# Patient Record
Sex: Female | Born: 1970 | Race: White | Hispanic: No | Smoking: Never smoker
Health system: Southern US, Community
[De-identification: ages and names within clinical notes are randomized; demographics above are authoritative.]

---

## 2011-04-06 DIAGNOSIS — D229 Melanocytic nevi, unspecified: Secondary | ICD-10-CM

## 2011-04-06 HISTORY — DX: Melanocytic nevi, unspecified: D22.9

## 2013-08-15 ENCOUNTER — Ambulatory Visit (INDEPENDENT_AMBULATORY_CARE_PROVIDER_SITE_OTHER): Payer: BC Managed Care – PPO

## 2013-08-15 DIAGNOSIS — Z23 Encounter for immunization: Secondary | ICD-10-CM

## 2013-08-22 ENCOUNTER — Ambulatory Visit: Payer: Self-pay

## 2014-08-02 ENCOUNTER — Ambulatory Visit (INDEPENDENT_AMBULATORY_CARE_PROVIDER_SITE_OTHER): Payer: BC Managed Care – PPO

## 2014-08-02 DIAGNOSIS — Z23 Encounter for immunization: Secondary | ICD-10-CM

## 2014-08-02 NOTE — Addendum Note (Signed)
Addended by: Jamelle Haring on: 08/02/2014 03:44 PM   Modules accepted: Orders

## 2015-08-02 ENCOUNTER — Ambulatory Visit (INDEPENDENT_AMBULATORY_CARE_PROVIDER_SITE_OTHER): Payer: BLUE CROSS/BLUE SHIELD

## 2015-08-02 DIAGNOSIS — Z23 Encounter for immunization: Secondary | ICD-10-CM

## 2016-04-10 ENCOUNTER — Encounter (INDEPENDENT_AMBULATORY_CARE_PROVIDER_SITE_OTHER): Payer: Self-pay

## 2016-08-11 ENCOUNTER — Ambulatory Visit (INDEPENDENT_AMBULATORY_CARE_PROVIDER_SITE_OTHER): Payer: BLUE CROSS/BLUE SHIELD

## 2016-08-11 DIAGNOSIS — Z23 Encounter for immunization: Secondary | ICD-10-CM

## 2016-08-17 ENCOUNTER — Ambulatory Visit: Payer: BLUE CROSS/BLUE SHIELD

## 2016-11-03 DIAGNOSIS — L814 Other melanin hyperpigmentation: Secondary | ICD-10-CM | POA: Diagnosis not present

## 2016-11-03 DIAGNOSIS — L821 Other seborrheic keratosis: Secondary | ICD-10-CM | POA: Diagnosis not present

## 2016-11-03 DIAGNOSIS — D492 Neoplasm of unspecified behavior of bone, soft tissue, and skin: Secondary | ICD-10-CM | POA: Diagnosis not present

## 2016-11-03 DIAGNOSIS — D229 Melanocytic nevi, unspecified: Secondary | ICD-10-CM | POA: Diagnosis not present

## 2017-02-09 DIAGNOSIS — D229 Melanocytic nevi, unspecified: Secondary | ICD-10-CM | POA: Diagnosis not present

## 2017-02-09 DIAGNOSIS — L821 Other seborrheic keratosis: Secondary | ICD-10-CM | POA: Diagnosis not present

## 2017-08-10 DIAGNOSIS — D492 Neoplasm of unspecified behavior of bone, soft tissue, and skin: Secondary | ICD-10-CM | POA: Diagnosis not present

## 2017-08-10 DIAGNOSIS — D485 Neoplasm of uncertain behavior of skin: Secondary | ICD-10-CM | POA: Diagnosis not present

## 2017-08-26 ENCOUNTER — Ambulatory Visit (INDEPENDENT_AMBULATORY_CARE_PROVIDER_SITE_OTHER): Payer: BLUE CROSS/BLUE SHIELD

## 2017-08-26 DIAGNOSIS — Z23 Encounter for immunization: Secondary | ICD-10-CM | POA: Diagnosis not present

## 2018-01-31 ENCOUNTER — Encounter: Payer: Self-pay | Admitting: Nurse Practitioner

## 2018-01-31 ENCOUNTER — Ambulatory Visit: Payer: BLUE CROSS/BLUE SHIELD | Admitting: Nurse Practitioner

## 2018-01-31 VITALS — BP 130/90 | HR 84 | Temp 96.7°F | Wt 191.0 lb

## 2018-01-31 DIAGNOSIS — R059 Cough, unspecified: Secondary | ICD-10-CM

## 2018-01-31 DIAGNOSIS — R05 Cough: Secondary | ICD-10-CM

## 2018-01-31 MED ORDER — PREDNISONE 20 MG PO TABS: ORAL_TABLET | ORAL | 0 refills | Status: DC

## 2018-01-31 MED ORDER — BENZONATATE 100 MG PO CAPS
100.0000 mg | ORAL_CAPSULE | Freq: Three times a day (TID) | ORAL | 0 refills | Status: DC | PRN
Start: 2018-01-31 — End: 2018-02-09

## 2018-01-31 MED ORDER — FLUTICASONE PROPIONATE 50 MCG/ACT NA SUSP
2.0000 | Freq: Every day | NASAL | 6 refills | Status: DC
Start: 2018-01-31 — End: 2021-07-10

## 2018-01-31 NOTE — Progress Notes (Signed)
   Subjective:    Patient ID: Whitney Huff, female    DOB: [birth year], 47 y.o.   MRN: [id number]  HPI Patient comes in today accompanied by her mom and daughter. She is c/o cough for about 4 weeks. Is a dry hacky cough. Has been using mucinex and that is not helping. Is exposed to cigarette smoke daily at work. She takes a daily zyrtec.    Review of Systems  Constitutional: Negative for chills and fever.  HENT: Positive for congestion.   Respiratory: Positive for cough. Negative for shortness of breath.   Cardiovascular: Negative.   Gastrointestinal: Negative.   Genitourinary: Negative.   Neurological: Negative.   Psychiatric/Behavioral: Negative.   All other systems reviewed and are negative.      Objective:   Physical Exam  Constitutional: She is oriented to person, place, and time. She appears well-developed and well-nourished. She appears distressed (mild).  HENT:  Right Ear: External ear normal.  Left Ear: External ear normal.  Nose: Nose normal.  Mouth/Throat: Oropharynx is clear and moist.  Eyes: Pupils are equal, round, and reactive to light.  Neck: Normal range of motion. Neck supple.  Cardiovascular: Normal rate and regular rhythm.  Pulmonary/Chest: Effort normal and breath sounds normal.  Lymphadenopathy:    She has no cervical adenopathy.  Neurological: She is alert and oriented to person, place, and time.  Skin: Skin is warm and dry.  Psychiatric: She has a normal mood and affect. Her behavior is normal. Judgment and thought content normal.   BP 130/90   Pulse 84   Temp (!) 96.7 F (35.9 C) (Oral)   Wt 191 lb (86.6 kg)         Assessment & Plan:   1. Cough    Meds ordered this encounter  Medications  . fluticasone (FLONASE) 50 MCG/ACT nasal spray    Sig: Place 2 sprays into both nostrils daily.    Dispense:  16 g    Refill:  6    Order Specific Question:   Supervising Provider    Answer:   VINCENT, CAROL L [[id number]]  . predniSONE (DELTASONE) 20  MG tablet    Sig: 2 po at sametime daily for 5 days    Dispense:  10 tablet    Refill:  0    Order Specific Question:   Supervising Provider    Answer:   VINCENT, CAROL L [[id number]]  . benzonatate (TESSALON PERLES) 100 MG capsule    Sig: Take 1 capsule (100 mg total) by mouth 3 (three) times daily as needed for cough.    Dispense:  20 capsule    Refill:  0    Order Specific Question:   Supervising Provider    Answer:   Eustaquio Maize [[id number]]   Force fluids  Rest RTO prn  Mary-Margaret Hassell Done, FNP

## 2018-01-31 NOTE — Patient Instructions (Signed)

## 2018-02-09 ENCOUNTER — Encounter: Payer: Self-pay | Admitting: Family Medicine

## 2018-02-09 ENCOUNTER — Ambulatory Visit: Payer: BLUE CROSS/BLUE SHIELD | Admitting: Family Medicine

## 2018-02-09 VITALS — BP 136/86 | HR 95 | Temp 97.0°F | Ht >= 80 in | Wt 193.0 lb

## 2018-02-09 DIAGNOSIS — J309 Allergic rhinitis, unspecified: Secondary | ICD-10-CM | POA: Diagnosis not present

## 2018-02-09 DIAGNOSIS — H1013 Acute atopic conjunctivitis, bilateral: Secondary | ICD-10-CM | POA: Diagnosis not present

## 2018-02-09 MED ORDER — METHYLPREDNISOLONE ACETATE 80 MG/ML IJ SUSP
40.0000 mg | Freq: Once | INTRAMUSCULAR | Status: AC
  Administered 2018-02-09: 40 mg via INTRAMUSCULAR

## 2018-02-09 MED ORDER — OLOPATADINE HCL 0.1 % OP SOLN: 1.0000 [drp] | Freq: Two times a day (BID) | OPHTHALMIC | 12 refills | Status: DC

## 2018-02-09 NOTE — Patient Instructions (Signed)
I prescribed you Patanol eyedrops.  You may place 1 drop in each eye twice a day as needed for eye allergies.  I recommend for the next few days that you add Claritin to your current allergy regimen.  Take Zyrtec and Claritin about 12 hours apart from each other.  Continue Flonase.  Consider using sinus rinses.  This will likely get any residual pollen out of your sinuses and reduce your allergy response.  You are given a dose of steroid through an injection today to help reduce her allergy response.  If you notice persistent significant allergy symptoms, we may need to consider referral to an allergy specialist to further assist.  Allergic Rhinitis, Adult Allergic rhinitis is an allergic reaction that affects the mucous membrane inside the nose. It causes sneezing, a runny or stuffy nose, and the feeling of mucus going down the back of the throat (postnasal drip). Allergic rhinitis can be mild to severe. There are two types of allergic rhinitis:  Seasonal. This type is also called hay fever. It happens only during certain seasons.  Perennial. This type can happen at any time of the year.  What are the causes? This condition happens when the body's defense system (immune system) responds to certain harmless substances called allergens as though they were germs.  Seasonal allergic rhinitis is triggered by pollen, which can come from grasses, trees, and weeds. Perennial allergic rhinitis may be caused by:  House dust mites.  Pet dander.  Mold spores.  What are the signs or symptoms? Symptoms of this condition include:  Sneezing.  Runny or stuffy nose (nasal congestion).  Postnasal drip.  Itchy nose.  Tearing of the eyes.  Trouble sleeping.  Daytime sleepiness.  How is this diagnosed? This condition may be diagnosed based on:  Your medical history.  A physical exam.  Tests to check for related conditions, such as: ? Asthma. ? Pink eye. ? Ear infection. ? Upper respiratory  infection.  Tests to find out which allergens trigger your symptoms. These may include skin or blood tests.  How is this treated? There is no cure for this condition, but treatment can help control symptoms. Treatment may include:  Taking medicines that block allergy symptoms, such as antihistamines. Medicine may be given as a shot, nasal spray, or pill.  Avoiding the allergen.  Desensitization. This treatment involves getting ongoing shots until your body becomes less sensitive to the allergen. This treatment may be done if other treatments do not help.  If taking medicine and avoiding the allergen does not work, new, stronger medicines may be prescribed.  Follow these instructions at home:  Find out what you are allergic to. Common allergens include smoke, dust, and pollen.  Avoid the things you are allergic to. These are some things you can do to help avoid allergens: ? Replace carpet with wood, tile, or vinyl flooring. Carpet can trap dander and dust. ? Do not smoke. Do not allow smoking in your home. ? Change your heating and air conditioning filter at least once a month. ? During allergy season:  Keep windows closed as much as possible.  Plan outdoor activities when pollen counts are lowest. This is usually during the evening hours.  When coming indoors, change clothing and shower before sitting on furniture or bedding.  Take over-the-counter and prescription medicines only as told by your health care provider.  Keep all follow-up visits as told by your health care provider. This is important. Contact a health care provider if:  You have a fever.  You develop a persistent cough.  You make whistling sounds when you breathe (you wheeze).  Your symptoms interfere with your normal daily activities. Get help right away if:  You have shortness of breath. Summary  This condition can be managed by taking medicines as directed and avoiding allergens.  Contact your  health care provider if you develop a persistent cough or fever.  During allergy season, keep windows closed as much as possible. This information is not intended to replace advice given to you by your health care provider. Make sure you discuss any questions you have with your health care provider. Document Released: 07/07/2001 Document Revised: 11/19/2016 Document Reviewed: 11/19/2016 Elsevier Interactive Patient Education  2018 [employer].  Sinus Rinse What is a sinus rinse? A sinus rinse is a home treatment. It rinses your sinuses with a mixture of salt and water (saline solution). Sinuses are air-filled spaces in your skull behind the bones of your face and forehead. They open into your nasal cavity. To do a sinus rinse, you will need:  Saline solution.  Neti pot or spray bottle. This releases the saline solution into your nose and through your sinuses. You can buy neti pots and spray bottles at: ? [employer]. ? A health food store. ? Online.  When should I do a sinus rinse? A sinus rinse can help to clear your nasal cavity. It can clear:  Mucus.  Dirt.  Dust.  Pollen.  You may do a sinus rinse when you have:  A cold.  A virus.  Allergies.  A sinus infection.  A stuffy nose.  If you are considering a sinus rinse:  Ask your child's doctor before doing a sinus rinse on your child.  Do not do a sinus rinse if you have had: ? Ear or nasal surgery. ? An ear infection. ? Blocked ears.  How do I do a sinus rinse?  Wash your hands.  Disinfect your device using the directions that came with the device.  Dry your device.  Use the solution that comes with your device or one that is sold separately in stores. Follow the mixing directions on the package.  Fill your device with the amount of saline solution as stated in the device instructions.  Stand over a sink and tilt your head sideways over the sink.  Place the spout of the device in your upper  nostril (the one closer to the ceiling).  Gently pour or squeeze the saline solution into the nasal cavity. The liquid should drain to the lower nostril if you are not too congested.  Gently blow your nose. Blowing too hard may cause ear pain.  Repeat in the other nostril.  Clean and rinse your device with clean water.  Air-dry your device. Are there risks of a sinus rinse? Sinus rinse is normally very safe and helpful. However, there are a few risks, which include:  A burning feeling in the sinuses. This may happen if you do not make the saline solution as instructed. Make sure to follow all directions when making the saline solution.  Infection from unclean water. This is rare, but possible.  Nasal irritation.  This information is not intended to replace advice given to you by your health care provider. Make sure you discuss any questions you have with your health care provider. Document Released: 05/09/2014 Document Revised: 09/08/2016 Document Reviewed: 02/27/2014 Elsevier Interactive Patient Education  2017 [employer].

## 2018-02-09 NOTE — Progress Notes (Signed)
Subjective: CC: URI PCP: Chevis Pretty, FNP Whitney Huff is a 47 y.o. female presenting to clinic today for:  1. Sinus symptoms  Patient reports watery eyes, puffy eyes, facial and dental tenderness that onset after she cleaned pollen off of a vehicle earlier this week.  She has been using Zyrtec and Flonase for symptoms but symptoms have persisted.  She notes associated throat irritation.  She was actually seen about 10 days ago for similar symptoms and was placed on Tessalon Perles and oral prednisone.  She is also been using an over-the-counter allergy eyedrop with little relief in symptoms this go around.  She notes that she divided the prednisone to 20 mg daily for the last 10 days because she was unable to tolerate 40 mg at a time.  She denies fevers, chills, nausea, vomiting, purulence from nares.  No shortness of breath.  ROS: Per HPI  No Known Allergies No past medical history on file.  Current Outpatient Medications:  .  benzonatate (TESSALON PERLES) 100 MG capsule, Take 1 capsule (100 mg total) by mouth 3 (three) times daily as needed for cough., Disp: 20 capsule, Rfl: 0 .  esomeprazole (NEXIUM) 20 MG capsule, Take 20 mg by mouth daily at 12 noon., Disp: , Rfl:  .  fluticasone (FLONASE) 50 MCG/ACT nasal spray, Place 2 sprays into both nostrils daily., Disp: 16 g, Rfl: 6 .  predniSONE (DELTASONE) 20 MG tablet, 2 po at sametime daily for 5 days, Disp: 10 tablet, Rfl: 0 Social History   Socioeconomic History  . Marital status: Widowed    Spouse name: Not on file  . Number of children: Not on file  . Years of education: Not on file  . Highest education level: Not on file  Occupational History  . Not on file  Social Needs  . Financial resource strain: Not on file  . Food insecurity:    Worry: Not on file    Inability: Not on file  . Transportation needs:    Medical: Not on file    Non-medical: Not on file  Tobacco Use  . Smoking status: Never Smoker  .  Smokeless tobacco: Never Used  Substance and Sexual Activity  . Alcohol use: Not on file  . Drug use: Not on file  . Sexual activity: Not on file  Lifestyle  . Physical activity:    Days per week: Not on file    Minutes per session: Not on file  . Stress: Not on file  Relationships  . Social connections:    Talks on phone: Not on file    Gets together: Not on file    Attends religious service: Not on file    Active member of club or organization: Not on file    Attends meetings of clubs or organizations: Not on file    Relationship status: Not on file  . Intimate partner violence:    Fear of current or ex partner: Not on file    Emotionally abused: Not on file    Physically abused: Not on file    Forced sexual activity: Not on file  Other Topics Concern  . Not on file  Social History Narrative  . Not on file   No family history on file.  Objective: Office vital signs reviewed. BP 136/86   Pulse 95   Temp (!) 97 F (36.1 C) (Oral)   Ht 7' (2.134 m)   Wt 193 lb (87.5 kg)   BMI 19.23 kg/m  Physical Examination:  General: Awake, alert, well nourished, nontoxic, No acute distress HEENT: Normal    Neck: No masses palpated. No lymphadenopathy    Ears: Tympanic membranes intact, normal light reflex, no erythema, no bulging    Eyes: PERRLA, extraocular membranes intact, sclera white; eyes are tearing.  There is associated puffiness below each eye.    Nose: nasal turbinates moist, clear nasal discharge    Throat: moist mucus membranes, no erythema, no tonsillar exudate.  Airway is patent Cardio: regular rate and rhythm, S1S2 heard, no murmurs appreciated Pulm: clear to auscultation bilaterally, no wheezes, rhonchi or rales; normal work of breathing on room air  Assessment/ Plan: 47 y.o. female   1. Allergic sinusitis She has been afebrile and not demonstrating any infection of symptoms.  I suspect that symptoms are related to pollen and change in season.  She was given  a dose of Depo-Medrol 40 mg here in office.  I have recommended that she continue the Zyrtec and Flonase but add Claritin, separated by 12 hours from the Zyrtec.  I have also replaced her over-the-counter eyedrop with Patanol to use twice daily for allergic conjunctivitis.  We discussed sinus rinses and a handout was provided.  Reasons for return discussed.  If she has persistent symptoms, could consider referral to allergy for further evaluation and management. - methylPREDNISolone acetate (DEPO-MEDROL) injection 40 mg  2. Allergic conjunctivitis of both eyes As above.  Meds ordered this encounter  Medications  . olopatadine (PATANOL) 0.1 % ophthalmic solution    Sig: Place 1 drop into both eyes 2 (two) times daily. For allergy    Dispense:  5 mL    Refill:  12  . methylPREDNISolone acetate (DEPO-MEDROL) injection 40 mg     Whitney Norlander, DO [facility] [phone]

## 2018-04-12 DIAGNOSIS — D229 Melanocytic nevi, unspecified: Secondary | ICD-10-CM | POA: Diagnosis not present

## 2018-04-12 DIAGNOSIS — L918 Other hypertrophic disorders of the skin: Secondary | ICD-10-CM | POA: Diagnosis not present

## 2018-04-12 DIAGNOSIS — D485 Neoplasm of uncertain behavior of skin: Secondary | ICD-10-CM | POA: Diagnosis not present

## 2018-04-25 ENCOUNTER — Encounter: Payer: Self-pay | Admitting: Family

## 2018-04-25 ENCOUNTER — Ambulatory Visit: Payer: BLUE CROSS/BLUE SHIELD | Admitting: Family

## 2018-04-25 VITALS — BP 146/96 | HR 95 | Temp 98.5°F | Ht 66.0 in | Wt 198.2 lb

## 2018-04-25 DIAGNOSIS — J020 Streptococcal pharyngitis: Secondary | ICD-10-CM | POA: Diagnosis not present

## 2018-04-25 DIAGNOSIS — J029 Acute pharyngitis, unspecified: Secondary | ICD-10-CM | POA: Diagnosis not present

## 2018-04-25 LAB — RAPID STREP SCREEN (MED CTR MEBANE ONLY): Strep Gp A Ag, IA W/Reflex: POSITIVE — AB

## 2018-04-25 MED ORDER — AMOXICILLIN 500 MG PO CAPS: 500.0000 mg | ORAL_CAPSULE | Freq: Two times a day (BID) | ORAL | 0 refills | Status: DC

## 2018-04-25 NOTE — Progress Notes (Signed)
   Subjective:    Patient ID: Whitney Huff, female    DOB: 1971-06-18, 47 y.o.   MRN: [id number]  Chief Complaint  Patient presents with  . Sore Throat    began yesterday  . Cough    Sore Throat   This is a new problem. The current episode started yesterday. The problem has been gradually worsening. There has been no fever. The pain is at a severity of 9/10. The pain is mild. Associated symptoms include congestion, coughing, a hoarse voice and trouble swallowing. Pertinent negatives include no ear discharge, ear pain, headaches or swollen glands. She has tried nothing for the symptoms. The treatment provided no relief.  Cough  Pertinent negatives include no ear pain or headaches.      Review of Systems  HENT: Positive for congestion, hoarse voice and trouble swallowing. Negative for ear discharge and ear pain.   Respiratory: Positive for cough.   Neurological: Negative for headaches.  All other systems reviewed and are negative.      Objective:   Physical Exam  Constitutional: She is oriented to person, place, and time. She appears well-developed and well-nourished. No distress.  HENT:  Head: Normocephalic and atraumatic.  Right Ear: External ear normal.  Nose: Mucosal edema and rhinorrhea present.  Mouth/Throat: Posterior oropharyngeal erythema present.  Eyes: Pupils are equal, round, and reactive to light.  Neck: Normal range of motion. Neck supple. No thyromegaly present.  Cardiovascular: Normal rate, regular rhythm, normal heart sounds and intact distal pulses.  No murmur heard. Pulmonary/Chest: Effort normal and breath sounds normal. No respiratory distress. She has no wheezes.  Abdominal: Soft. Bowel sounds are normal. She exhibits no distension. There is no tenderness.  Musculoskeletal: Normal range of motion. She exhibits no edema or tenderness.  Neurological: She is alert and oriented to person, place, and time. She has normal reflexes. No cranial nerve deficit.    Skin: Skin is warm and dry.  Psychiatric: She has a normal mood and affect. Her behavior is normal. Judgment and thought content normal.  Vitals reviewed.     BP (!) 146/96   Pulse 95   Temp 98.5 F (36.9 C) (Oral)   Ht 5\' 6"  (1.676 m)   Wt 198 lb 3.2 oz (89.9 kg)   BMI 31.99 kg/m      Assessment & Plan:  1. Sore throat - Rapid Strep Screen ([facility] & [facility] ONLY)  2. Strep throat - Take meds as prescribed - Use a cool mist humidifier  -Use saline nose sprays frequently -Force fluids -For any cough or congestion  Use plain Mucinex- regular strength or max strength is fine -For fever or aces or pains- take tylenol or ibuprofen. -Throat lozenges if help -New toothbrush in 3 days - amoxicillin (AMOXIL) 500 MG capsule; Take 1 capsule (500 mg total) by mouth 2 (two) times daily.  Dispense: 14 capsule; Refill: 0   Evelina Dun, FNP

## 2018-04-25 NOTE — Patient Instructions (Signed)
Strep Throat Strep throat is a bacterial infection of the throat. Your health care provider may call the infection tonsillitis or pharyngitis, depending on whether there is swelling in the tonsils or at the back of the throat. Strep throat is most common during the cold months of the year in children who are 5-49 years of age, but it can happen during any season in people of any age. This infection is spread from person to person (contagious) through coughing, sneezing, or close contact. What are the causes? Strep throat is caused by the bacteria called Streptococcus pyogenes. What increases the risk? This condition is more likely to develop in:  People who spend time in crowded places where the infection can spread easily.  People who have close contact with someone who has strep throat.  What are the signs or symptoms? Symptoms of this condition include:  Fever or chills.  Redness, swelling, or pain in the tonsils or throat.  Pain or difficulty when swallowing.  White or yellow spots on the tonsils or throat.  Swollen, tender glands in the neck or under the jaw.  Red rash all over the body (rare).  How is this diagnosed? This condition is diagnosed by performing a rapid strep test or by taking a swab of your throat (throat culture test). Results from a rapid strep test are usually ready in a few minutes, but throat culture test results are available after one or two days. How is this treated? This condition is treated with antibiotic medicine. Follow these instructions at home: Medicines  Take over-the-counter and prescription medicines only as told by your health care provider.  Take your antibiotic as told by your health care provider. Do not stop taking the antibiotic even if you start to feel better.  Have family members who also have a sore throat or fever tested for strep throat. They may need antibiotics if they have the strep infection. Eating and drinking  Do not  share food, drinking cups, or personal items that could cause the infection to spread to other people.  If swallowing is difficult, try eating soft foods until your sore throat feels better.  Drink enough fluid to keep your urine clear or pale yellow. General instructions  Gargle with a salt-water mixture 3-4 times per day or as needed. To make a salt-water mixture, completely dissolve -1 tsp of salt in 1 cup of warm water.  Make sure that all household members wash their hands well.  Get plenty of rest.  Stay home from school or work until you have been taking antibiotics for 24 hours.  Keep all follow-up visits as told by your health care provider. This is important. Contact a health care provider if:  The glands in your neck continue to get bigger.  You develop a rash, cough, or earache.  You cough up a thick liquid that is green, yellow-brown, or bloody.  You have pain or discomfort that does not get better with medicine.  Your problems seem to be getting worse rather than better.  You have a fever. Get help right away if:  You have new symptoms, such as vomiting, severe headache, stiff or painful neck, chest pain, or shortness of breath.  You have severe throat pain, drooling, or changes in your voice.  You have swelling of the neck, or the skin on the neck becomes red and tender.  You have signs of dehydration, such as fatigue, dry mouth, and decreased urination.  You become increasingly sleepy, or   you cannot wake up completely.  Your joints become red or painful. This information is not intended to replace advice given to you by your health care provider. Make sure you discuss any questions you have with your health care provider. Document Released: 10/09/2000 Document Revised: 06/10/2016 Document Reviewed: 02/04/2015 Elsevier Interactive Patient Education  2018 [employer]  

## 2018-04-29 ENCOUNTER — Encounter: Payer: Self-pay | Admitting: Nurse Practitioner

## 2018-04-29 ENCOUNTER — Ambulatory Visit: Payer: BLUE CROSS/BLUE SHIELD | Admitting: Nurse Practitioner

## 2018-04-29 VITALS — BP 137/100 | HR 83 | Temp 97.7°F | Ht 66.0 in | Wt 194.0 lb

## 2018-04-29 DIAGNOSIS — R05 Cough: Secondary | ICD-10-CM | POA: Diagnosis not present

## 2018-04-29 DIAGNOSIS — R059 Cough, unspecified: Secondary | ICD-10-CM

## 2018-04-29 MED ORDER — METHYLPREDNISOLONE ACETATE 80 MG/ML IJ SUSP
80.0000 mg | Freq: Once | INTRAMUSCULAR | Status: AC
  Administered 2018-04-29: 80 mg via INTRAMUSCULAR

## 2018-04-29 NOTE — Patient Instructions (Signed)

## 2018-04-29 NOTE — Progress Notes (Signed)
   Subjective:    Patient ID: Whitney Huff, female    DOB: 1971/01/17, 47 y.o.   MRN: [id number]   Chief Complaint: Cough   HPI Patient here today c/o cough. She was seen on Monday with strep. Was treated with amoxicillin. Sore thorat is better but she has bad cough.    Review of Systems  Constitutional: Negative for chills and fever.  HENT: Positive for congestion. Negative for sinus pressure, sore throat and trouble swallowing.   Respiratory: Positive for cough.   Cardiovascular: Negative.   Gastrointestinal: Negative.   Genitourinary: Negative.   Neurological: Negative.   Psychiatric/Behavioral: Negative.   All other systems reviewed and are negative.      Objective:   Physical Exam  Constitutional: She is oriented to person, place, and time. She appears well-developed and well-nourished. She appears distressed (mild).  HENT:  Right Ear: Hearing, tympanic membrane, external ear and ear canal normal.  Left Ear: Hearing, tympanic membrane, external ear and ear canal normal.  Nose: Mucosal edema and rhinorrhea present. Right sinus exhibits no maxillary sinus tenderness and no frontal sinus tenderness. Left sinus exhibits no maxillary sinus tenderness and no frontal sinus tenderness.  Mouth/Throat: Uvula is midline, oropharynx is clear and moist and mucous membranes are normal.  Neck: Normal range of motion. Neck supple.  Cardiovascular: Normal rate and regular rhythm.  Pulmonary/Chest: Effort normal and breath sounds normal.  Dry cough   Lymphadenopathy:    She has no cervical adenopathy.  Neurological: She is alert and oriented to person, place, and time.  Skin: Skin is warm and dry.  Psychiatric: She has a normal mood and affect. Her behavior is normal. Judgment and thought content normal.    BP (!) 137/100   Pulse 83   Temp 97.7 F (36.5 C) (Oral)   Ht 5\' 6"  (1.676 m)   Wt 194 lb (88 kg)   BMI 31.31 kg/m        Assessment & Plan:  Whitney Huff in  today with chief complaint of Cough   1. Cough 1. Take meds as prescribed 2. Use a cool mist humidifier especially during the winter months and when heat has been humid. 3. Use saline nose sprays frequently 4. Saline irrigations of the nose can be very helpful if Huff frequently.  * 4X daily for 1 week*  * Use of a nettie pot can be helpful with this. Follow directions with this* 5. Drink plenty of fluids 6. Keep thermostat turn down low 7.For any cough or congestion  Use plain Mucinex- regular strength or max strength is fine   * Children- consult with Pharmacist for dosing 8. For fever or aces or pains- take tylenol or ibuprofen appropriate for age and weight.  * for fevers greater than 101 orally you may alternate ibuprofen and tylenol every  3 hours.    - methylPREDNISolone acetate (DEPO-MEDROL) injection 80 mg   Whitney Hassell Done, FNP

## 2018-08-10 ENCOUNTER — Ambulatory Visit (INDEPENDENT_AMBULATORY_CARE_PROVIDER_SITE_OTHER): Payer: BLUE CROSS/BLUE SHIELD

## 2018-08-10 DIAGNOSIS — Z23 Encounter for immunization: Secondary | ICD-10-CM

## 2018-08-19 ENCOUNTER — Encounter: Payer: Self-pay | Admitting: Nurse Practitioner

## 2018-08-19 ENCOUNTER — Ambulatory Visit: Payer: BLUE CROSS/BLUE SHIELD | Admitting: Nurse Practitioner

## 2018-08-19 VITALS — BP 139/93 | HR 103 | Temp 98.2°F | Ht 66.0 in | Wt 198.0 lb

## 2018-08-19 DIAGNOSIS — R42 Dizziness and giddiness: Secondary | ICD-10-CM

## 2018-08-19 MED ORDER — LORATADINE 10 MG PO TABS: 10.0000 mg | ORAL_TABLET | Freq: Every day | ORAL | 11 refills | Status: DC

## 2018-08-19 MED ORDER — PREDNISONE 20 MG PO TABS: ORAL_TABLET | ORAL | 0 refills | Status: DC

## 2018-08-19 NOTE — Progress Notes (Signed)
   Subjective:    Patient ID: Whitney Huff, female    DOB: [birth year], 47 y.o.   MRN: [id number]   Chief Complaint: feels funny  HPI Patient comes in today c/o feeling light headed. Started intermittent last week. Occurs daily and can last a few seconds. She feels it coming on and then goes away. Slight cough.   Review of Systems  Constitutional: Negative for chills and fever.  HENT: Positive for congestion and rhinorrhea (slight).   Respiratory: Positive for cough (occasional).   Neurological: Negative.   Psychiatric/Behavioral: Negative.   All other systems reviewed and are negative.      Objective:   Physical Exam  Constitutional: She is oriented to person, place, and time. She appears well-developed and well-nourished.  Cardiovascular: Normal rate and regular rhythm.  Pulmonary/Chest: Effort normal.  Neurological: She is alert and oriented to person, place, and time. No cranial nerve deficit.  Negative rhomberg   Skin: Skin is warm.  Psychiatric: She has a normal mood and affect. Her behavior is normal. Thought content normal.  Nursing note and vitals reviewed.  BP (!) 139/93   Pulse (!) 103   Temp 98.2 F (36.8 C) (Oral)   Ht 5\' 6"  (1.676 m)   Wt 198 lb (89.8 kg)   BMI 31.96 kg/m      Assessment & Plan:  Graciela Rasor in today with chief complaint of No chief complaint on file.   1. Vertigo Force fluids Rest  RTO prn  - predniSONE (DELTASONE) 20 MG tablet; 2 po at sametime daily for 5 days  Dispense: 10 tablet; Refill: 0 - loratadine (CLARITIN) 10 MG tablet; Take 1 tablet (10 mg total) by mouth daily.  Dispense: 30 tablet; Refill: [place], FNP

## 2018-08-29 ENCOUNTER — Encounter: Payer: Self-pay | Admitting: Family Medicine

## 2018-08-29 ENCOUNTER — Ambulatory Visit: Payer: BLUE CROSS/BLUE SHIELD | Admitting: Family Medicine

## 2018-08-29 VITALS — BP 121/85 | HR 108 | Temp 97.1°F | Ht 66.0 in | Wt 193.0 lb

## 2018-08-29 DIAGNOSIS — J0100 Acute maxillary sinusitis, unspecified: Secondary | ICD-10-CM

## 2018-08-29 DIAGNOSIS — H66009 Acute suppurative otitis media without spontaneous rupture of ear drum, unspecified ear: Secondary | ICD-10-CM | POA: Diagnosis not present

## 2018-08-29 MED ORDER — BETAMETHASONE SOD PHOS & ACET 6 (3-3) MG/ML IJ SUSP
6.0000 mg | Freq: Once | INTRAMUSCULAR | Status: AC
  Administered 2018-08-29: 6 mg via INTRAMUSCULAR

## 2018-08-29 MED ORDER — CEFUROXIME AXETIL 500 MG PO TABS: 500.0000 mg | ORAL_TABLET | Freq: Two times a day (BID) | ORAL | 0 refills | Status: AC

## 2018-08-29 NOTE — Progress Notes (Addendum)
Subjective:  Patient ID: Whitney Huff, female    DOB: 12-02-1970  Age: 47 y.o. MRN: [id number]  CC: Sore Throat; Cough; Fever; Nasal Congestion; Headache; and Ear Pain   HPI Whitney Huff presents for two weeks feeling light headed. No relief with prednisone pack finished 5 days ago. ST now gone.  Fever 201 yesterday.  She has been congested.  Headache as well.  And says that both the ears her left greater than right.  Left was this morning.  She has had some episodes of chills as well.  Onset of all of this was about a week ago with the exception of the lightheadedness which is actually been going on longer.  Depression screen [facility] 2/9 08/29/2018 08/19/2018 04/29/2018  Decreased Interest 0 0 0  Down, Depressed, Hopeless 0 0 0  PHQ - 2 Score 0 0 0    History Whitney Huff has no past medical history on file.   She has no past surgical history on file.   Her family history is not on file.She reports that she has never smoked. She has never used smokeless tobacco. She reports that she does not drink alcohol or use drugs.    ROS Review of Systems  Constitutional: Negative.  Negative for activity change, appetite change, chills and fever.  HENT: Positive for ear pain. Negative for ear discharge, hearing loss, nosebleeds, sneezing and trouble swallowing.   Eyes: Negative for visual disturbance.  Respiratory: Negative for chest tightness and shortness of breath.   Cardiovascular: Negative for chest pain and palpitations.  Gastrointestinal: Negative for abdominal pain.  Musculoskeletal: Negative for arthralgias.  Skin: Negative for rash.  Neurological: Positive for dizziness and light-headedness.    Objective:  BP 121/85 (BP Location: Left Arm)   Pulse (!) 108   Temp (!) 97.1 F (36.2 C) (Oral)   Ht 5\' 6"  (1.676 m)   Wt 193 lb (87.5 kg)   BMI 31.15 kg/m   BP Readings from Last 3 Encounters:  08/29/18 121/85  08/19/18 (!) 139/93  04/29/18 (!) 137/100    Wt Readings from  Last 3 Encounters:  08/29/18 193 lb (87.5 kg)  08/19/18 198 lb (89.8 kg)  04/29/18 194 lb (88 kg)     Physical Exam  Constitutional: She appears well-developed and well-nourished.  HENT:  Head: Normocephalic and atraumatic.  Right Ear: External ear normal. Tympanic membrane is injected and erythematous. No decreased hearing is noted.  Left Ear: External ear normal. A middle ear effusion is present. No decreased hearing is noted.  Nose: Mucosal edema present. Right sinus exhibits no frontal sinus tenderness. Left sinus exhibits no frontal sinus tenderness.  Mouth/Throat: Oropharynx is clear and moist. No oropharyngeal exudate or posterior oropharyngeal erythema.  Neck: No Brudzinski's sign noted.  Pulmonary/Chest: No respiratory distress. She has wheezes.  Lymphadenopathy:       Head (right side): No preauricular adenopathy present.       Head (left side): No preauricular adenopathy present.       Right cervical: No superficial cervical adenopathy present.      Left cervical: No superficial cervical adenopathy present.      Assessment & Plan:   Whitney Huff was seen today for sore throat, cough, fever, nasal congestion, headache and ear pain.  Diagnoses and all orders for this visit:  Acute maxillary sinusitis, recurrence not specified -     betamethasone acetate-betamethasone sodium phosphate (CELESTONE) injection 6 mg  Acute suppurative otitis media without spontaneous rupture of ear drum, recurrence not specified,  unspecified laterality  Other orders -     cefUROXime (CEFTIN) 500 MG tablet; Take 1 tablet (500 mg total) by mouth 2 (two) times daily with a meal for 10 days.       I have discontinued Anderson Malta Rocchi's predniSONE. I am also having her start on cefUROXime. Additionally, I am having her maintain her esomeprazole, fluticasone, cetirizine, olopatadine, and loratadine. We administered betamethasone acetate-betamethasone sodium phosphate.  Allergies as of 08/29/2018    No Known Allergies     Medication List        Accurate as of 08/29/18  9:48 AM. Always use your most recent med list.          cefUROXime 500 MG tablet Commonly known as:  CEFTIN Take 1 tablet (500 mg total) by mouth 2 (two) times daily with a meal for 10 days.   cetirizine 10 MG tablet Commonly known as:  ZYRTEC Take 10 mg by mouth daily.   esomeprazole 20 MG capsule Commonly known as:  NEXIUM Take 20 mg by mouth daily at 12 noon.   fluticasone 50 MCG/ACT nasal spray Commonly known as:  FLONASE Place 2 sprays into both nostrils daily.   loratadine 10 MG tablet Commonly known as:  CLARITIN Take 1 tablet (10 mg total) by mouth daily.   olopatadine 0.1 % ophthalmic solution Commonly known as:  PATANOL Place 1 drop into both eyes 2 (two) times daily. For allergy        Follow-up: Return if symptoms worsen or fail to improve.  Claretta Fraise, M.D.

## 2018-08-29 NOTE — Addendum Note (Signed)
Addended by: Claretta Fraise on: 08/29/2018 09:48 AM   Modules accepted: Orders

## 2018-08-29 NOTE — Patient Instructions (Signed)
Increase water intake to 64 OZ a day  D/C Claritin

## 2018-09-27 ENCOUNTER — Telehealth: Payer: Self-pay | Admitting: Nurse Practitioner

## 2018-09-27 NOTE — Telephone Encounter (Signed)
Pt states she was seen 09/19/18 with Dr Livia Snellen and she is still c/o dizziness but other symptoms have resolved. She says both her ears are still hurting. Does she ntbs again or is there something else we can do?

## 2018-09-27 NOTE — Telephone Encounter (Signed)
Pt. Needs to be seen for this. Thanks, WS 

## 2018-09-27 NOTE — Telephone Encounter (Signed)
Pt scheduled appt with 12/6 with Dr Livia Snellen at 8:10.

## 2018-09-30 ENCOUNTER — Encounter: Payer: Self-pay | Admitting: Family Medicine

## 2018-09-30 ENCOUNTER — Ambulatory Visit: Payer: BLUE CROSS/BLUE SHIELD | Admitting: Family Medicine

## 2018-09-30 VITALS — BP 125/81 | HR 71 | Temp 96.7°F | Ht 66.0 in | Wt 196.0 lb

## 2018-09-30 DIAGNOSIS — R42 Dizziness and giddiness: Secondary | ICD-10-CM | POA: Diagnosis not present

## 2018-09-30 DIAGNOSIS — J301 Allergic rhinitis due to pollen: Secondary | ICD-10-CM

## 2018-09-30 DIAGNOSIS — J309 Allergic rhinitis, unspecified: Secondary | ICD-10-CM

## 2018-09-30 MED ORDER — PSEUDOEPHEDRINE-GUAIFENESIN ER 120-1200 MG PO TB12: 1.0000 | ORAL_TABLET | Freq: Two times a day (BID) | ORAL | 0 refills | Status: DC

## 2018-09-30 MED ORDER — AMOXICILLIN-POT CLAVULANATE 875-125 MG PO TABS: 1.0000 | ORAL_TABLET | Freq: Two times a day (BID) | ORAL | 0 refills | Status: DC

## 2018-09-30 NOTE — Progress Notes (Signed)
Subjective:  Patient ID: Whitney Huff, female    DOB: 1971-06-12  Age: 47 y.o. MRN: [id number]  CC: ? Inner ear problems   HPI Whitney Huff presents for continued problems with dizziness.  She describes this as a lightheaded kind of swimmy headed feeling when she sitting at her desk.  No longer limited to when she first stands.  She also gets that same sensation at times when she is laying down at night.  There is a randomness to this that it is hard for her to relate to any specific incident etc.  She denies room spinning.  She says she is drinking 64 ounces of water a day to avoid orthostasis.  She does have upper respiratory congestion and takes Zyrtec daily for that.  Denies changes in her hearing.  History of allergic rhinitis for which she takes daily Zyrtec  Depression screen [facility] 2/9 09/30/2018 08/29/2018 08/19/2018  Decreased Interest 0 0 0  Down, Depressed, Hopeless 0 0 0  PHQ - 2 Score 0 0 0    History Whitney Huff has no past medical history on file.   She has no past surgical history on file.   Her family history is not on file.She reports that she has never smoked. She has never used smokeless tobacco. She reports that she does not drink alcohol or use drugs.    ROS Review of Systems  Constitutional: Negative for appetite change, chills, diaphoresis, fatigue and fever.  HENT: Positive for congestion, ear pain (feels like something is in them - water) and postnasal drip. Negative for hearing loss, rhinorrhea, sore throat and trouble swallowing.   Respiratory: Negative for cough, chest tightness and shortness of breath.   Cardiovascular: Negative for chest pain and palpitations.  Gastrointestinal: Negative for abdominal pain.  Musculoskeletal: Negative for arthralgias.  Skin: Negative for rash.    Objective:  BP 125/81   Pulse 71   Temp (!) 96.7 F (35.9 C) (Oral)   Ht 5\' 6"  (1.676 m)   Wt 196 lb (88.9 kg)   BMI 31.64 kg/m   BP Readings from Last 3  Encounters:  09/30/18 125/81  08/29/18 121/85  08/19/18 (!) 139/93    Wt Readings from Last 3 Encounters:  09/30/18 196 lb (88.9 kg)  08/29/18 193 lb (87.5 kg)  08/19/18 198 lb (89.8 kg)     Physical Exam  Constitutional: She is oriented to person, place, and time. She appears well-developed and well-nourished. No distress.  HENT:  Head: Normocephalic and atraumatic.  Right Ear: External ear normal.  Left Ear: External ear normal.  Nose: Nose normal.  Mouth/Throat: Oropharynx is clear and moist.  Eyes: Pupils are equal, round, and reactive to light. Conjunctivae and EOM are normal.  Neck: Normal range of motion. Neck supple. No thyromegaly present.  Cardiovascular: Normal rate, regular rhythm and normal heart sounds.  No murmur heard. Pulmonary/Chest: Effort normal and breath sounds normal. No respiratory distress. She has no wheezes. She has no rales.  Abdominal: Soft. There is no tenderness.  Lymphadenopathy:    She has no cervical adenopathy.  Neurological: She is alert and oriented to person, place, and time. She has normal reflexes. She displays normal reflexes. No cranial nerve deficit. She exhibits normal muscle tone. Coordination normal.  Skin: Skin is warm and dry.  Psychiatric: She has a normal mood and affect. Her behavior is normal. Judgment and thought content normal.      Assessment & Plan:   Whitney Huff was seen today for ?  inner ear problems.  Diagnoses and all orders for this visit:  Dizziness  Seasonal allergic rhinitis due to pollen  Allergic sinusitis  Other orders -     amoxicillin-clavulanate (AUGMENTIN) 875-125 MG tablet; Take 1 tablet by mouth 2 (two) times daily. -     Pseudoephedrine-Guaifenesin [phone] MG TB12; Take 1 tablet by mouth 2 (two) times daily. For congestion       I have discontinued Whitney Huff's loratadine. I am also having her start on amoxicillin-clavulanate and Pseudoephedrine-Guaifenesin. Additionally, I am having  her maintain her esomeprazole, fluticasone, cetirizine, and olopatadine.  Allergies as of 09/30/2018   No Known Allergies     Medication List        Accurate as of 09/30/18  9:08 AM. Always use your most recent med list.          amoxicillin-clavulanate 875-125 MG tablet Commonly known as:  AUGMENTIN Take 1 tablet by mouth 2 (two) times daily.   cetirizine 10 MG tablet Commonly known as:  ZYRTEC Take 10 mg by mouth daily.   esomeprazole 20 MG capsule Commonly known as:  NEXIUM Take 20 mg by mouth daily at 12 noon.   fluticasone 50 MCG/ACT nasal spray Commonly known as:  FLONASE Place 2 sprays into both nostrils daily.   olopatadine 0.1 % ophthalmic solution Commonly known as:  PATANOL Place 1 drop into both eyes 2 (two) times daily. For allergy   Pseudoephedrine-Guaifenesin [phone] MG Tb12 Take 1 tablet by mouth 2 (two) times daily. For congestion      Patient declined CT of the sinuses due to cost.  Follow-up: Return in about 6 weeks (around 11/11/2018).  Claretta Fraise, M.D.

## 2018-12-08 DIAGNOSIS — D485 Neoplasm of uncertain behavior of skin: Secondary | ICD-10-CM | POA: Diagnosis not present

## 2019-07-17 ENCOUNTER — Telehealth: Payer: Self-pay | Admitting: Nurse Practitioner

## 2019-07-17 ENCOUNTER — Other Ambulatory Visit: Payer: Self-pay

## 2019-07-18 ENCOUNTER — Ambulatory Visit (INDEPENDENT_AMBULATORY_CARE_PROVIDER_SITE_OTHER): Payer: BC Managed Care – PPO

## 2019-07-18 DIAGNOSIS — Z23 Encounter for immunization: Secondary | ICD-10-CM

## 2020-08-20 ENCOUNTER — Other Ambulatory Visit: Payer: Self-pay

## 2020-08-20 ENCOUNTER — Ambulatory Visit (INDEPENDENT_AMBULATORY_CARE_PROVIDER_SITE_OTHER): Payer: BC Managed Care – PPO

## 2020-08-20 DIAGNOSIS — Z23 Encounter for immunization: Secondary | ICD-10-CM

## 2020-10-14 DIAGNOSIS — H40033 Anatomical narrow angle, bilateral: Secondary | ICD-10-CM | POA: Diagnosis not present

## 2020-10-14 DIAGNOSIS — H2513 Age-related nuclear cataract, bilateral: Secondary | ICD-10-CM | POA: Diagnosis not present

## 2021-03-26 ENCOUNTER — Other Ambulatory Visit: Payer: Self-pay

## 2021-03-26 ENCOUNTER — Encounter: Payer: Self-pay | Admitting: Physician Assistant

## 2021-03-26 ENCOUNTER — Ambulatory Visit: Payer: BC Managed Care – PPO | Admitting: Physician Assistant

## 2021-03-26 DIAGNOSIS — Z1283 Encounter for screening for malignant neoplasm of skin: Secondary | ICD-10-CM | POA: Diagnosis not present

## 2021-03-26 DIAGNOSIS — L719 Rosacea, unspecified: Secondary | ICD-10-CM | POA: Diagnosis not present

## 2021-03-26 DIAGNOSIS — L820 Inflamed seborrheic keratosis: Secondary | ICD-10-CM

## 2021-03-26 DIAGNOSIS — Z86018 Personal history of other benign neoplasm: Secondary | ICD-10-CM | POA: Diagnosis not present

## 2021-03-26 MED ORDER — ALCLOMETASONE DIPROPIONATE 0.05 % EX CREA: TOPICAL_CREAM | Freq: Two times a day (BID) | CUTANEOUS | 7 refills | Status: DC | PRN

## 2021-03-26 MED ORDER — METRONIDAZOLE 0.75 % EX CREA: TOPICAL_CREAM | Freq: Two times a day (BID) | CUTANEOUS | 8 refills | Status: DC

## 2021-03-26 NOTE — Progress Notes (Signed)
   Follow-Up Visit   Subjective  Whitney Huff is a 50 y.o. female who presents for the following: Annual Exam (Patient has a list, chest isk? New x months, right buttock raised new, scalp raised to touch new x months. H/o mild and severe atypia). Her face is flaring x months. It gets pink and it is very dry and scaly. No pain but wants treatment. She has never had this before   The following portions of the chart were reviewed this encounter and updated as appropriate:  Tobacco  Allergies  Meds  Problems  Med Hx  Surg Hx  Fam Hx      Objective  Well appearing patient in no apparent distress; mood and affect are within normal limits.  A full examination was performed including scalp, head, eyes, ears, nose, lips, neck, chest, axillae, abdomen, back, buttocks, bilateral upper extremities, bilateral lower extremities, hands, feet, fingers, toes, fingernails, and toenails. All findings within normal limits unless otherwise noted below.  Objective  Chest - Medial (Center) (2), Left Inframammary Fold, Left Thigh - Anterior, Mid Forehead, Mid Parietal Scalp, Right Inframammary Fold, Right Thigh - Posterior: Brown crusts  Objective  both cheeks: Erythematous xerosis   Assessment & Plan  Seborrheic keratosis, inflamed (8) Left Thigh - Anterior; Right Thigh - Posterior; Chest - Medial (Center) (2); Left Inframammary Fold; Right Inframammary Fold; Mid Parietal Scalp; Mid Forehead  Destruction of lesion - Chest - Medial (Center), Left Inframammary Fold, Left Thigh - Anterior, Mid Forehead, Mid Parietal Scalp, Right Inframammary Fold, Right Thigh - Posterior Complexity: simple   Destruction method: cryotherapy   Informed consent: discussed and consent obtained   Timeout:  patient name, date of birth, surgical site, and procedure verified Lesion destroyed using liquid nitrogen: Yes   Cryotherapy cycles:  3 Outcome: patient tolerated procedure well with no complications     Rosacea both cheeks  metroNIDAZOLE (METROCREAM) 0.75 % cream - both cheeks  alclomethasone (ACLOVATE) 0.05 % cream - both cheeks    I, Jule Schlabach, PA-C, have reviewed all documentation's for this visit.  The documentation on 03/26/21 for the exam, diagnosis, procedures and orders are all accurate and complete.

## 2021-03-27 ENCOUNTER — Telehealth: Payer: Self-pay | Admitting: Physician Assistant

## 2021-03-27 NOTE — Telephone Encounter (Signed)
Patient called to say that the 2 medications that [facility], PA-C prescribed were $298.00 (together) and she cannot afford them.  What can she do?

## 2021-03-31 ENCOUNTER — Telehealth: Payer: Self-pay | Admitting: *Deleted

## 2021-03-31 DIAGNOSIS — L719 Rosacea, unspecified: Secondary | ICD-10-CM

## 2021-03-31 NOTE — Telephone Encounter (Signed)
Patient will call back with what pharmacy she wants [place] to call prescription in so that she can get through goodrx.

## 2021-03-31 NOTE — Telephone Encounter (Signed)
Left message for patient to call us back- was calling to inform patient that both medications on affordable on [email or web address]

## 2021-03-31 NOTE — Telephone Encounter (Signed)
Patient is calling back to say that even with [place] alone that the medications are over $200.

## 2021-03-31 NOTE — Telephone Encounter (Signed)
medications are cheaper with goodrx coupon but patient wasn't sure if she wanted to drive to [place] or not. Once she figures out where she wants to get medications with goodrx coupon she will call us back.

## 2021-06-20 ENCOUNTER — Ambulatory Visit (INDEPENDENT_AMBULATORY_CARE_PROVIDER_SITE_OTHER): Payer: BC Managed Care – PPO | Admitting: *Deleted

## 2021-06-20 ENCOUNTER — Other Ambulatory Visit: Payer: Self-pay

## 2021-06-20 DIAGNOSIS — Z23 Encounter for immunization: Secondary | ICD-10-CM

## 2021-07-01 ENCOUNTER — Other Ambulatory Visit: Payer: Self-pay | Admitting: Nurse Practitioner

## 2021-07-01 DIAGNOSIS — Z1231 Encounter for screening mammogram for malignant neoplasm of breast: Secondary | ICD-10-CM

## 2021-07-10 ENCOUNTER — Ambulatory Visit: Payer: BC Managed Care – PPO | Admitting: Nurse Practitioner

## 2021-07-10 ENCOUNTER — Encounter: Payer: Self-pay | Admitting: Nurse Practitioner

## 2021-07-10 ENCOUNTER — Other Ambulatory Visit: Payer: Self-pay

## 2021-07-10 VITALS — BP 159/94 | HR 78 | Temp 98.0°F | Resp 20 | Ht 66.0 in | Wt 198.0 lb

## 2021-07-10 DIAGNOSIS — R222 Localized swelling, mass and lump, trunk: Secondary | ICD-10-CM

## 2021-07-10 DIAGNOSIS — Z23 Encounter for immunization: Secondary | ICD-10-CM

## 2021-07-10 NOTE — Progress Notes (Signed)
Subjective:    Patient ID: Whitney Huff, female    DOB: 22-Apr-1971, 50 y.o.   MRN: [id number]  Chief Complaint: ? Umbilical hernia   HPI Patient comes in today wondering if she has an umbilical hernia. She says her stomach hurts at times when she picks stuff up. She says pain  it is superficial. Sh edenies ever having abdominal surgery other then c section.     Review of Systems  Constitutional:  Negative for diaphoresis.  Eyes:  Negative for pain.  Respiratory:  Negative for shortness of breath.   Cardiovascular:  Negative for chest pain, palpitations and leg swelling.  Gastrointestinal:  Negative for abdominal pain.  Endocrine: Negative for polydipsia.  Skin:  Negative for rash.  Neurological:  Negative for dizziness, weakness and headaches.  Hematological:  Does not bruise/bleed easily.  All other systems reviewed and are negative.     Objective:   Physical Exam Vitals and nursing note reviewed.  Constitutional:      General: She is not in acute distress.    Appearance: Normal appearance. She is well-developed.  HENT:     Head: Normocephalic.     Right Ear: Tympanic membrane normal.     Left Ear: Tympanic membrane normal.     Nose: Nose normal.     Mouth/Throat:     Mouth: Mucous membranes are moist.  Eyes:     Pupils: Pupils are equal, round, and reactive to light.  Neck:     Vascular: No carotid bruit or JVD.  Cardiovascular:     Rate and Rhythm: Normal rate and regular rhythm.     Heart sounds: Normal heart sounds.  Pulmonary:     Effort: Pulmonary effort is normal. No respiratory distress.     Breath sounds: Normal breath sounds. No wheezing or rales.  Chest:     Chest wall: No tenderness.  Abdominal:     General: Bowel sounds are normal. There is no distension or abdominal bruit.     Palpations: Abdomen is soft. There is mass (tender palpabe nodule about 3cm in size just above umbilicus). There is no hepatomegaly, splenomegaly or pulsatile mass.      Tenderness: There is no abdominal tenderness.     Hernia: No hernia is present.  Musculoskeletal:        General: Normal range of motion.     Cervical back: Normal range of motion and neck supple.  Lymphadenopathy:     Cervical: No cervical adenopathy.  Skin:    General: Skin is warm and dry.  Neurological:     Mental Status: She is alert and oriented to person, place, and time.     Deep Tendon Reflexes: Reflexes are normal and symmetric.  Psychiatric:        Behavior: Behavior normal.        Thought Content: Thought content normal.        Judgment: Judgment normal.    BP (!) 159/94   Pulse 78   Temp 98 F (36.7 C) (Temporal)   Resp 20   Ht '5\' 6"'$  (1.676 m)   Wt 198 lb (89.8 kg)   SpO2 99%   BMI 31.96 kg/m        Assessment & Plan:   Whitney Huff in today with chief complaint of ? Umbilical hernia   1. Subcutaneous nodule of abdominal wall Continue to watch area Will call with test results - US Abdomen Limited; Future    The above assessment and management plan  was discussed with the patient. The patient verbalized understanding of and has agreed to the management plan. Patient is aware to call the clinic if symptoms persist or worsen. Patient is aware when to return to the clinic for a follow-up visit. Patient educated on when it is appropriate to go to the emergency department.   Mary-Margaret Hassell Done, FNP

## 2021-07-11 ENCOUNTER — Telehealth: Payer: Self-pay | Admitting: Nurse Practitioner

## 2021-07-18 ENCOUNTER — Other Ambulatory Visit: Payer: Self-pay

## 2021-07-18 ENCOUNTER — Ambulatory Visit (HOSPITAL_COMMUNITY)
Admission: RE | Admit: 2021-07-18 | Discharge: 2021-07-18 | Disposition: A | Discharge status: Home / Self Care | Payer: BC Managed Care – PPO | Source: Ambulatory Visit | Attending: Nurse Practitioner | Admitting: Nurse Practitioner

## 2021-07-18 DIAGNOSIS — R222 Localized swelling, mass and lump, trunk: Secondary | ICD-10-CM | POA: Insufficient documentation

## 2021-07-18 DIAGNOSIS — R1900 Intra-abdominal and pelvic swelling, mass and lump, unspecified site: Secondary | ICD-10-CM | POA: Diagnosis not present

## 2021-08-20 ENCOUNTER — Ambulatory Visit
Admission: RE | Admit: 2021-08-20 | Discharge: 2021-08-20 | Disposition: A | Discharge status: Home / Self Care | Payer: BC Managed Care – PPO | Source: Ambulatory Visit | Attending: Nurse Practitioner | Admitting: Nurse Practitioner

## 2021-08-20 ENCOUNTER — Other Ambulatory Visit: Payer: Self-pay

## 2021-08-20 DIAGNOSIS — Z1231 Encounter for screening mammogram for malignant neoplasm of breast: Secondary | ICD-10-CM

## 2021-08-22 ENCOUNTER — Other Ambulatory Visit: Payer: Self-pay

## 2021-08-22 ENCOUNTER — Ambulatory Visit (INDEPENDENT_AMBULATORY_CARE_PROVIDER_SITE_OTHER): Payer: BC Managed Care – PPO

## 2021-08-22 DIAGNOSIS — Z23 Encounter for immunization: Secondary | ICD-10-CM

## 2022-01-16 DIAGNOSIS — Z23 Encounter for immunization: Secondary | ICD-10-CM | POA: Diagnosis not present

## 2022-01-22 NOTE — Telephone Encounter (Signed)
This was moved to a phone call because they need it on a letter head. FYI  ?

## 2022-01-22 NOTE — Telephone Encounter (Signed)
I do not need to see patient to have letter written ?

## 2022-02-03 DIAGNOSIS — F419 Anxiety disorder, unspecified: Secondary | ICD-10-CM | POA: Diagnosis not present

## 2022-02-03 DIAGNOSIS — F32A Depression, unspecified: Secondary | ICD-10-CM | POA: Diagnosis not present

## 2022-03-31 ENCOUNTER — Encounter: Payer: Self-pay | Admitting: Nurse Practitioner

## 2022-03-31 ENCOUNTER — Ambulatory Visit: Payer: BC Managed Care – PPO | Admitting: Nurse Practitioner

## 2022-03-31 VITALS — BP 147/79 | HR 80 | Temp 98.5°F | Ht 66.0 in

## 2022-03-31 DIAGNOSIS — N6002 Solitary cyst of left breast: Secondary | ICD-10-CM | POA: Diagnosis not present

## 2022-03-31 MED ORDER — SULFAMETHOXAZOLE-TRIMETHOPRIM 800-160 MG PO TABS: 1.0000 | ORAL_TABLET | Freq: Two times a day (BID) | ORAL | 0 refills | Status: DC

## 2022-03-31 NOTE — Progress Notes (Signed)
Subjective:    Patient ID: Whitney Huff, female    DOB: [birth year], 51 y.o.   MRN: [id number]  Chief Complaint: Cyst (Pimple on left nipple, since memorial wkend)   HPI Patient comes in today stating she has a cyst on her left nipple.found it last week. No drainage.    Review of Systems  Constitutional:  Negative for diaphoresis.  Eyes:  Negative for pain.  Respiratory:  Negative for shortness of breath.   Cardiovascular:  Negative for chest pain, palpitations and leg swelling.  Gastrointestinal:  Negative for abdominal pain.  Endocrine: Negative for polydipsia.  Skin:  Negative for rash.  Neurological:  Negative for dizziness, weakness and headaches.  Hematological:  Does not bruise/bleed easily.  All other systems reviewed and are negative.     Objective:   Physical Exam Vitals and nursing note reviewed.  Constitutional:      General: She is not in acute distress.    Appearance: Normal appearance. She is well-developed.  Neck:     Vascular: No carotid bruit or JVD.  Cardiovascular:     Rate and Rhythm: Normal rate and regular rhythm.     Heart sounds: Normal heart sounds.  Pulmonary:     Effort: Pulmonary effort is normal. No respiratory distress.     Breath sounds: Normal breath sounds. No wheezing or rales.  Chest:     Chest wall: No tenderness.  Breasts:    Left: Skin change (small cyst left nipple- yellowish excudate) present. No inverted nipple or nipple discharge.  Abdominal:     General: Bowel sounds are normal. There is no distension or abdominal bruit.     Palpations: Abdomen is soft. There is no hepatomegaly, splenomegaly, mass or pulsatile mass.     Tenderness: There is no abdominal tenderness.  Musculoskeletal:        General: Normal range of motion.     Cervical back: Normal range of motion and neck supple.  Lymphadenopathy:     Cervical: No cervical adenopathy.  Skin:    General: Skin is warm and dry.  Neurological:     Mental Status: She is  alert and oriented to person, place, and time.     Deep Tendon Reflexes: Reflexes are normal and symmetric.  Psychiatric:        Behavior: Behavior normal.        Thought Content: Thought content normal.        Judgment: Judgment normal.   BP (!) 147/79   Pulse 80   Temp 98.5 F (36.9 C) (Oral)   Ht '5\' 6"'$  (1.676 m)   SpO2 97%   BMI 31.96 kg/m         Assessment & Plan:  Whitney Huff in today with chief complaint of Cyst (Pimple on left nipple, since memorial wkend)   1. Cyst of left nipple Do not pick at area Take all antibiotics moist compresses  Meds ordered this encounter  Medications   sulfamethoxazole-trimethoprim (BACTRIM DS) 800-160 MG tablet    Sig: Take 1 tablet by mouth 2 (two) times daily.    Dispense:  20 tablet    Refill:  0    Order Specific Question:   Supervising Provider    Answer:   Caryl Pina A [[id number]]     The above assessment and management plan was discussed with the patient. The patient verbalized understanding of and has agreed to the management plan. Patient is aware to call the clinic if symptoms persist or worsen. Patient  is aware when to return to the clinic for a follow-up visit. Patient educated on when it is appropriate to go to the emergency department.   Mary-Margaret Hassell Done, FNP

## 2022-04-07 ENCOUNTER — Encounter: Payer: BC Managed Care – PPO | Admitting: Nurse Practitioner

## 2022-04-08 ENCOUNTER — Ambulatory Visit (INDEPENDENT_AMBULATORY_CARE_PROVIDER_SITE_OTHER): Payer: BC Managed Care – PPO

## 2022-04-08 ENCOUNTER — Ambulatory Visit (INDEPENDENT_AMBULATORY_CARE_PROVIDER_SITE_OTHER): Payer: BC Managed Care – PPO | Admitting: Nurse Practitioner

## 2022-04-08 ENCOUNTER — Encounter: Payer: Self-pay | Admitting: Nurse Practitioner

## 2022-04-08 ENCOUNTER — Other Ambulatory Visit (HOSPITAL_COMMUNITY)
Admission: RE | Admit: 2022-04-08 | Discharge: 2022-04-08 | Disposition: A | Discharge status: Home / Self Care | Payer: BC Managed Care – PPO | Source: Ambulatory Visit | Attending: Nurse Practitioner | Admitting: Nurse Practitioner

## 2022-04-08 ENCOUNTER — Ambulatory Visit: Payer: BC Managed Care – PPO

## 2022-04-08 VITALS — BP 127/82 | HR 80 | Temp 97.8°F | Resp 20 | Ht 66.0 in | Wt 193.0 lb

## 2022-04-08 DIAGNOSIS — Z0001 Encounter for general adult medical examination with abnormal findings: Secondary | ICD-10-CM

## 2022-04-08 DIAGNOSIS — Z1211 Encounter for screening for malignant neoplasm of colon: Secondary | ICD-10-CM

## 2022-04-08 DIAGNOSIS — Z0000 Encounter for general adult medical examination without abnormal findings: Secondary | ICD-10-CM

## 2022-04-08 DIAGNOSIS — E559 Vitamin D deficiency, unspecified: Secondary | ICD-10-CM | POA: Diagnosis not present

## 2022-04-08 DIAGNOSIS — K219 Gastro-esophageal reflux disease without esophagitis: Secondary | ICD-10-CM

## 2022-04-08 DIAGNOSIS — Z1212 Encounter for screening for malignant neoplasm of rectum: Secondary | ICD-10-CM

## 2022-04-08 LAB — MICROSCOPIC EXAMINATION
RBC, Urine: NONE SEEN /hpf (ref 0–2)
Renal Epithel, UA: NONE SEEN /hpf

## 2022-04-08 LAB — URINALYSIS, COMPLETE
Bilirubin, UA: NEGATIVE
Glucose, UA: NEGATIVE
Ketones, UA: NEGATIVE
Leukocytes,UA: NEGATIVE
Nitrite, UA: NEGATIVE
Protein,UA: NEGATIVE
RBC, UA: NEGATIVE
Specific Gravity, UA: >1.03 — ABNORMAL HIGH (ref 1.005–1.030)
Urobilinogen, Ur: 1 mg/dL (ref 0.2–1.0)
pH, UA: 5 (ref 5.0–7.5)

## 2022-04-08 MED ORDER — ESOMEPRAZOLE MAGNESIUM 20 MG PO CPDR: 20.0000 mg | DELAYED_RELEASE_CAPSULE | Freq: Every day | ORAL | 1 refills | Status: DC

## 2022-04-08 MED ORDER — NORGESTIM-ETH ESTRAD TRIPHASIC 0.18/0.215/0.25 MG-35 MCG PO TABS: 1.0000 | ORAL_TABLET | Freq: Every day | ORAL | 11 refills | Status: DC

## 2022-04-08 MED ORDER — DIALYVITE VITAMIN D 5000 125 MCG (5000 UT) PO CAPS: 5000.0000 [IU] | ORAL_CAPSULE | Freq: Every day | ORAL | 1 refills | Status: AC

## 2022-04-08 NOTE — Patient Instructions (Signed)
Exercising to Stay Healthy To become healthy and stay healthy, it is recommended that you do moderate-intensity and vigorous-intensity exercise. You can tell that you are exercising at a moderate intensity if your heart starts beating faster and you start breathing faster but can still hold a conversation. You can tell that you are exercising at a vigorous intensity if you are breathing much harder and faster and cannot hold a conversation while exercising. How can exercise benefit me? Exercising regularly is important. It has many health benefits, such as: Improving overall fitness, flexibility, and endurance. Increasing bone density. Helping with weight control. Decreasing body fat. Increasing muscle strength and endurance. Reducing stress and tension, anxiety, depression, or anger. Improving overall health. What guidelines should I follow while exercising? Before you start a new exercise program, talk with your health care provider. Do not exercise so much that you hurt yourself, feel dizzy, or get very short of breath. Wear comfortable clothes and wear shoes with good support. Drink plenty of water while you exercise to prevent dehydration or heat stroke. Work out until your breathing and your heartbeat get faster (moderate intensity). How often should I exercise? Choose an activity that you enjoy, and set realistic goals. Your health care provider can help you make an activity plan that is individually designed and works best for you. Exercise regularly as told by your health care provider. This may include: Doing strength training two times a week, such as: Lifting weights. Using resistance bands. Push-ups. Sit-ups. Yoga. Doing a certain intensity of exercise for a given amount of time. Choose from these options: A total of 150 minutes of moderate-intensity exercise every week. A total of 75 minutes of vigorous-intensity exercise every week. A mix of moderate-intensity and  vigorous-intensity exercise every week. Children, pregnant women, people who have not exercised regularly, people who are overweight, and older adults may need to talk with a health care provider about what activities are safe to perform. If you have a medical condition, be sure to talk with your health care provider before you start a new exercise program. What are some exercise ideas? Moderate-intensity exercise ideas include: Walking 1 mile (1.6 km) in about 15 minutes. Biking. Hiking. Golfing. Dancing. Water aerobics. Vigorous-intensity exercise ideas include: Walking 4.5 miles (7.2 km) or more in about 1 hour. Jogging or running 5 miles (8 km) in about 1 hour. Biking 10 miles (16.1 km) or more in about 1 hour. Lap swimming. Roller-skating or in-line skating. Cross-country skiing. Vigorous competitive sports, such as football, basketball, and soccer. Jumping rope. Aerobic dancing. What are some everyday activities that can help me get exercise? Yard work, such as: Pushing a lawn mower. Raking and bagging leaves. Washing your car. Pushing a stroller. Shoveling snow. Gardening. Washing windows or floors. How can I be more active in my day-to-day activities? Use stairs instead of an elevator. Take a walk during your lunch break. If you drive, park your car farther away from your work or school. If you take public transportation, get off one stop early and walk the rest of the way. Stand up or walk around during all of your indoor phone calls. Get up, stretch, and walk around every 30 minutes throughout the day. Enjoy exercise with a friend. Support to continue exercising will help you keep a regular routine of activity. Where to find more information You can find more information about exercising to stay healthy from: U.S. Department of Health and Human Services: www.hhs.gov Centers for Disease Control and Prevention (  CDC): www.cdc.gov Summary Exercising regularly is  important. It will improve your overall fitness, flexibility, and endurance. Regular exercise will also improve your overall health. It can help you control your weight, reduce stress, and improve your bone density. Do not exercise so much that you hurt yourself, feel dizzy, or get very short of breath. Before you start a new exercise program, talk with your health care provider. This information is not intended to replace advice given to you by your health care provider. Make sure you discuss any questions you have with your health care provider. Document Revised: 02/07/2021 Document Reviewed: 02/07/2021 Elsevier Patient Education  2023 Elsevier Inc.  

## 2022-04-08 NOTE — Progress Notes (Signed)
Subjective:    Patient ID: Whitney Huff, female    DOB: 1971-03-29, 51 y.o.   MRN: [id number]   Chief Complaint: annual physical   HPI:  Whitney Huff is a 51 y.o. who identifies as a female who was assigned female at birth.   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 Annual physical exam Is having pap today. LMP 2 weeks ago and was normal  2. Gastroesophageal reflux disease without esophagitis Is on nexium as needed.   3. Vitamin D deficiency Is on weekly vitamin d supplement     New complaints: None today  No Known Allergies Outpatient Encounter Medications as of 04/08/2022  Medication Sig   cetirizine (ZYRTEC) 10 MG tablet Take 10 mg by mouth daily.   Cholecalciferol (DIALYVITE VITAMIN D 5000) 125 MCG (5000 UT) capsule Take 5,000 Units by mouth daily.   esomeprazole (NEXIUM) 20 MG capsule Take 20 mg by mouth daily at 12 noon.   Norgestimate-Ethinyl Estradiol Triphasic (TRI-SPRINTEC) 0.18/0.215/0.25 MG-35 MCG tablet Take 1 tablet by mouth daily.   [DISCONTINUED] sulfamethoxazole-trimethoprim (BACTRIM DS) 800-160 MG tablet Take 1 tablet by mouth 2 (two) times daily.   No facility-administered encounter medications on file as of 04/08/2022.    History reviewed. No pertinent surgical history.  History reviewed. No pertinent family history.    Controlled substance contract: n/a     Review of Systems  Constitutional:  Negative for diaphoresis.  Eyes:  Negative for pain.  Respiratory:  Negative for shortness of breath.   Cardiovascular:  Negative for chest pain, palpitations and leg swelling.  Gastrointestinal:  Negative for abdominal pain.  Endocrine: Negative for polydipsia.  Skin:  Negative for rash.  Neurological:  Negative for dizziness, weakness and headaches.  Hematological:  Does not bruise/bleed easily.  All other systems  reviewed and are negative.      Objective:   Physical Exam Vitals and nursing note reviewed.  Constitutional:      General: She is not in acute distress.    Appearance: Normal appearance. She is well-developed.  HENT:     Head: Normocephalic.     Right Ear: Tympanic membrane normal.     Left Ear: Tympanic membrane normal.     Nose: Nose normal.     Mouth/Throat:     Mouth: Mucous membranes are moist.  Eyes:     Pupils: Pupils are equal, round, and reactive to light.  Neck:     Vascular: No carotid bruit or JVD.  Cardiovascular:     Rate and Rhythm: Normal rate and regular rhythm.     Heart sounds: Normal heart sounds.  Pulmonary:     Effort: Pulmonary effort is normal. No respiratory distress.     Breath sounds: Normal breath sounds. No wheezing or rales.  Chest:     Chest wall: No tenderness.  Abdominal:     General: Bowel sounds are normal. There is no distension or abdominal bruit.     Palpations: Abdomen is soft. There is no hepatomegaly, splenomegaly, mass or pulsatile mass.     Tenderness: There is no abdominal tenderness.  Genitourinary:    General: Normal vulva.     Vagina: No vaginal discharge.     Rectum: Normal.     Comments: Cervix parous and pink' no adnexal masses or tenderness Musculoskeletal:        General: Normal range of  motion.     Cervical back: Normal range of motion and neck supple.  Lymphadenopathy:     Cervical: No cervical adenopathy.  Skin:    General: Skin is warm and dry.  Neurological:     Mental Status: She is alert and oriented to person, place, and time.     Deep Tendon Reflexes: Reflexes are normal and symmetric.  Psychiatric:        Behavior: Behavior normal.        Thought Content: Thought content normal.        Judgment: Judgment normal.    BP 127/82   Pulse 80   Temp 97.8 F (36.6 C) (Temporal)   Resp 20   Ht _0  (1.676 m)   Wt 193 lb (87.5 kg)   LMP 03/19/2022 (Exact Date)   SpO2 100%   BMI 31.15 kg/m    Whitney Nissen, FNP Chest xray clear-Preliminary reading by Ronnald Collum, FNP  [facility]        Assessment & Plan:   Whitney Huff comes in today with chief complaint of Annual Exam   Diagnosis and orders addressed:  1. Annual physical exam Labs pending - CBC with Differential/Platelet - CMP14+EGFR - Lipid panel - Thyroid Panel With TSH - Cytology - PAP - DG Chest 2 View - EKG 12-Lead - Urinalysis, Complete  2. Gastroesophageal reflux disease without esophagitis Avoid spicy foods Do not eat 2 hours prior to bedtime  - esomeprazole (NEXIUM) 20 MG capsule; Take 1 capsule (20 mg total) by mouth daily at 12 noon.  Dispense: 90 capsule; Refill: 1  3. Vitamin D deficiency Continue vitamin d supplements   Labs pending Health Maintenance reviewed Diet and exercise encouraged  Follow up plan: 1 year    [place], FNP

## 2022-04-08 NOTE — Addendum Note (Signed)
Addended by: Chevis Pretty on: 04/08/2022 02:37 PM   Modules accepted: Orders

## 2022-04-09 ENCOUNTER — Encounter: Payer: Self-pay | Admitting: Physician Assistant

## 2022-04-09 ENCOUNTER — Telehealth: Payer: Self-pay | Admitting: *Deleted

## 2022-04-09 ENCOUNTER — Ambulatory Visit: Payer: BC Managed Care – PPO | Admitting: Physician Assistant

## 2022-04-09 DIAGNOSIS — Z1283 Encounter for screening for malignant neoplasm of skin: Secondary | ICD-10-CM | POA: Diagnosis not present

## 2022-04-09 DIAGNOSIS — D171 Benign lipomatous neoplasm of skin and subcutaneous tissue of trunk: Secondary | ICD-10-CM | POA: Diagnosis not present

## 2022-04-09 DIAGNOSIS — L820 Inflamed seborrheic keratosis: Secondary | ICD-10-CM

## 2022-04-09 DIAGNOSIS — Z86018 Personal history of other benign neoplasm: Secondary | ICD-10-CM

## 2022-04-09 DIAGNOSIS — L821 Other seborrheic keratosis: Secondary | ICD-10-CM

## 2022-04-09 LAB — CBC WITH DIFFERENTIAL/PLATELET
Basophils Absolute: 0.1 10*3/uL (ref 0.0–0.2)
Basos: 1 %
EOS (ABSOLUTE): 0.1 10*3/uL (ref 0.0–0.4)
Eos: 2 %
Hematocrit: 37.2 % (ref 34.0–46.6)
Hemoglobin: 12.3 g/dL (ref 11.1–15.9)
Immature Grans (Abs): 0 10*3/uL (ref 0.0–0.1)
Immature Granulocytes: 0 %
Lymphocytes Absolute: 1.3 10*3/uL (ref 0.7–3.1)
Lymphs: 27 %
MCH: 27.5 pg (ref 26.6–33.0)
MCHC: 33.1 g/dL (ref 31.5–35.7)
MCV: 83 fL (ref 79–97)
Monocytes Absolute: 0.4 10*3/uL (ref 0.1–0.9)
Monocytes: 8 %
Neutrophils Absolute: 3 10*3/uL (ref 1.4–7.0)
Neutrophils: 62 %
Platelets: 241 10*3/uL (ref 150–450)
RBC: 4.47 x10E6/uL (ref 3.77–5.28)
RDW: 13.6 % (ref 11.7–15.4)
WBC: 4.8 10*3/uL (ref 3.4–10.8)

## 2022-04-09 LAB — THYROID PANEL WITH TSH
Free Thyroxine Index: 2.8 (ref 1.2–4.9)
T3 Uptake Ratio: 22 % — ABNORMAL LOW (ref 24–39)
T4, Total: 12.7 ug/dL — ABNORMAL HIGH (ref 4.5–12.0)
TSH: 1.8 u[IU]/mL (ref 0.450–4.500)

## 2022-04-09 LAB — LIPID PANEL
Chol/HDL Ratio: 2.9 ratio (ref 0.0–4.4)
Cholesterol, Total: 180 mg/dL (ref 100–199)
HDL: 62 mg/dL (ref >39)
LDL Chol Calc (NIH): 96 mg/dL (ref 0–99)
Triglycerides: 128 mg/dL (ref 0–149)
VLDL Cholesterol Cal: 22 mg/dL (ref 5–40)

## 2022-04-09 LAB — CMP14+EGFR
ALT: 12 IU/L (ref 0–32)
AST: 14 IU/L (ref 0–40)
Albumin/Globulin Ratio: 1.3 (ref 1.2–2.2)
Albumin: 3.9 g/dL (ref 3.8–4.9)
Alkaline Phosphatase: 34 IU/L — ABNORMAL LOW (ref 44–121)
BUN/Creatinine Ratio: 8 — ABNORMAL LOW (ref 9–23)
BUN: 8 mg/dL (ref 6–24)
Bilirubin Total: 0.9 mg/dL (ref 0.0–1.2)
CO2: 18 mmol/L — ABNORMAL LOW (ref 20–29)
Calcium: 8.4 mg/dL — ABNORMAL LOW (ref 8.7–10.2)
Chloride: 100 mmol/L (ref 96–106)
Creatinine, Ser: 1.02 mg/dL — ABNORMAL HIGH (ref 0.57–1.00)
Globulin, Total: 3.1 g/dL (ref 1.5–4.5)
Glucose: 90 mg/dL (ref 70–99)
Potassium: 4.1 mmol/L (ref 3.5–5.2)
Sodium: 133 mmol/L — ABNORMAL LOW (ref 134–144)
Total Protein: 7 g/dL (ref 6.0–8.5)
eGFR: 66 mL/min/{1.73_m2} (ref >59)

## 2022-04-09 LAB — CYTOLOGY - PAP: Diagnosis: NEGATIVE

## 2022-04-09 NOTE — Telephone Encounter (Signed)
[facility] radiology called to make sure provider sees chest x-ray report

## 2022-04-14 ENCOUNTER — Encounter: Payer: Self-pay | Admitting: Physician Assistant

## 2022-04-14 NOTE — Progress Notes (Signed)
   Follow-Up Visit   Subjective  Whitney Huff is a 51 y.o. female who presents for the following: Annual Exam (Personal history of atypia, patient here for yearly full body skin check today). She had a mass on her abdomen and it was diagnosed a lipoma by ultrasound.    The following portions of the chart were reviewed this encounter and updated as appropriate:      Objective  Well appearing patient in no apparent distress; mood and affect are within normal limits.  A full examination was performed including scalp, head, eyes, ears, nose, lips, neck, chest, axillae, abdomen, back, buttocks, bilateral upper extremities, bilateral lower extremities, hands, feet, fingers, toes, fingernails, and toenails. All findings within normal limits unless otherwise noted below.  Full body skin exam.  Left Lower Leg - Posterior, Mid Back Stuck-on, waxy papules and plaques.   Left Abdomen (side) - Lower Rubbery slightly mobile mass.  Chest - Medial (Center) (3), Head - Anterior (Face) (6), Left Inguinal Area, Left Thigh - Anterior, Right Thigh - Anterior Stuck-on, waxy papules and plaques.    Assessment & Plan  Seborrheic keratosis (2) Left Lower Leg - Posterior; Mid Back  benign  Screening for malignant neoplasm of skin  Yearly skin exam.  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  Inflamed seborrheic keratosis (12) Head - Anterior (Face) (6); Left Thigh - Anterior; Right Thigh - Anterior; Chest - Medial (Center) (3); Left Inguinal Area  Destruction of lesion - Head - Anterior (Face) Complexity: simple   Destruction method: cryotherapy   Informed consent: discussed and consent obtained   Timeout:  patient name, date of birth, surgical site, and procedure verified Lesion destroyed using liquid nitrogen: Yes   Outcome: patient tolerated procedure well with no complications      I, Joson Sapp, PA-C, have reviewed all  documentation's for this visit.  The documentation on 04/14/22 for the exam, diagnosis, procedures and orders are all accurate and complete.

## 2022-06-25 IMAGING — MG MM DIGITAL SCREENING BILAT W/ TOMO AND CAD
8 series · 8 of 24 positions shown · non-contrast
Comparison: Previous exam(s).

CLINICAL DATA: Screening.

EXAM:
DIGITAL SCREENING BILATERAL MAMMOGRAM WITH TOMOSYNTHESIS AND CAD
TECHNIQUE: Bilateral screening digital craniocaudal and mediolateral oblique
mammograms were obtained. Bilateral screening digital breast
tomosynthesis was performed. The images were evaluated with
computer-aided detection.

[R MLO synth-2D]
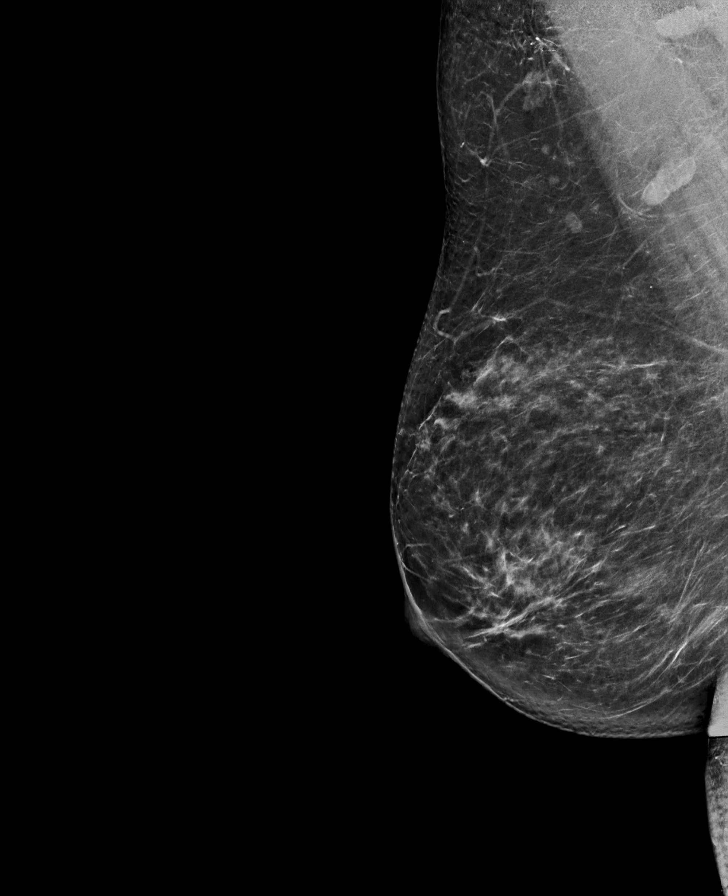

[L CC synth-2D]
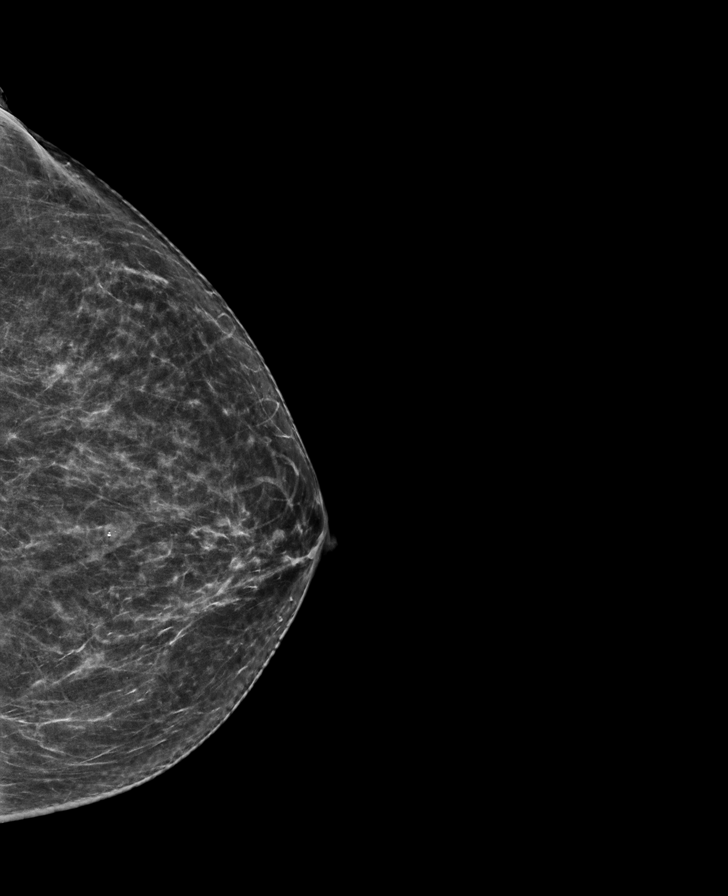

[R CC synth-2D]
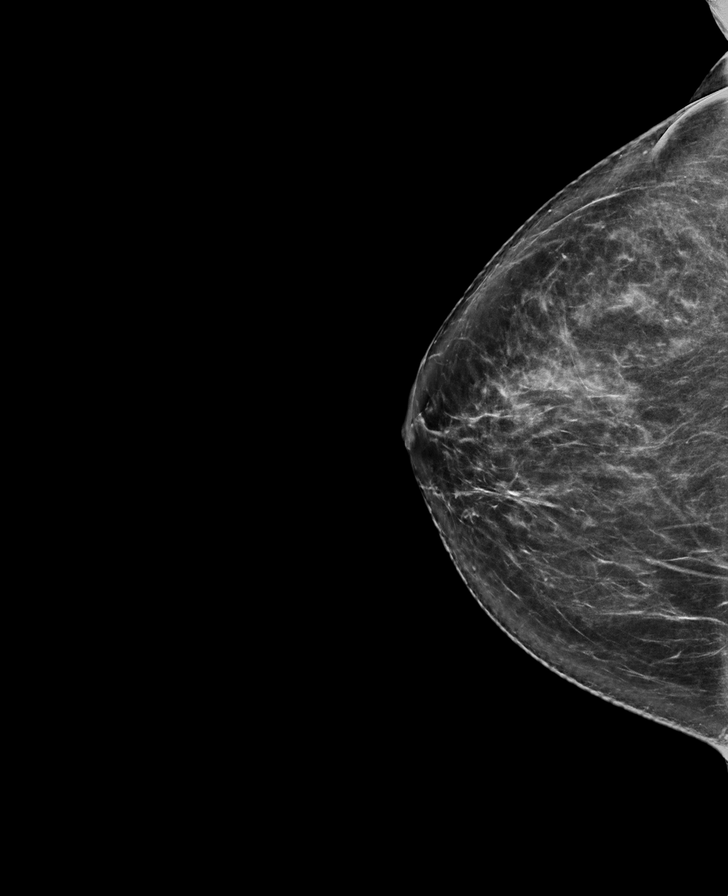

[L MLO synth-2D]
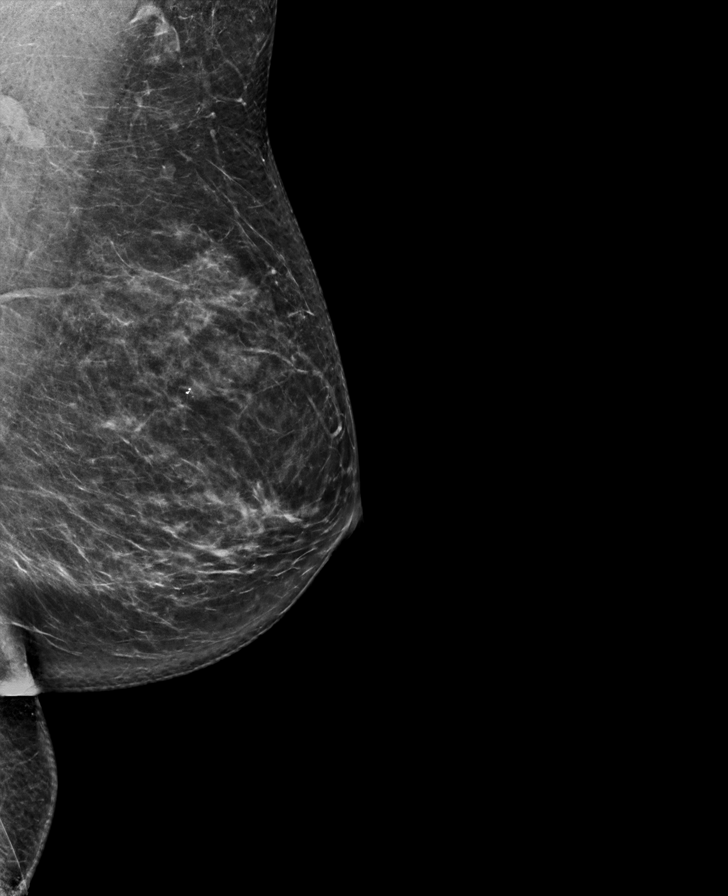

[L CC tomo · tomo slice 35/68.0]
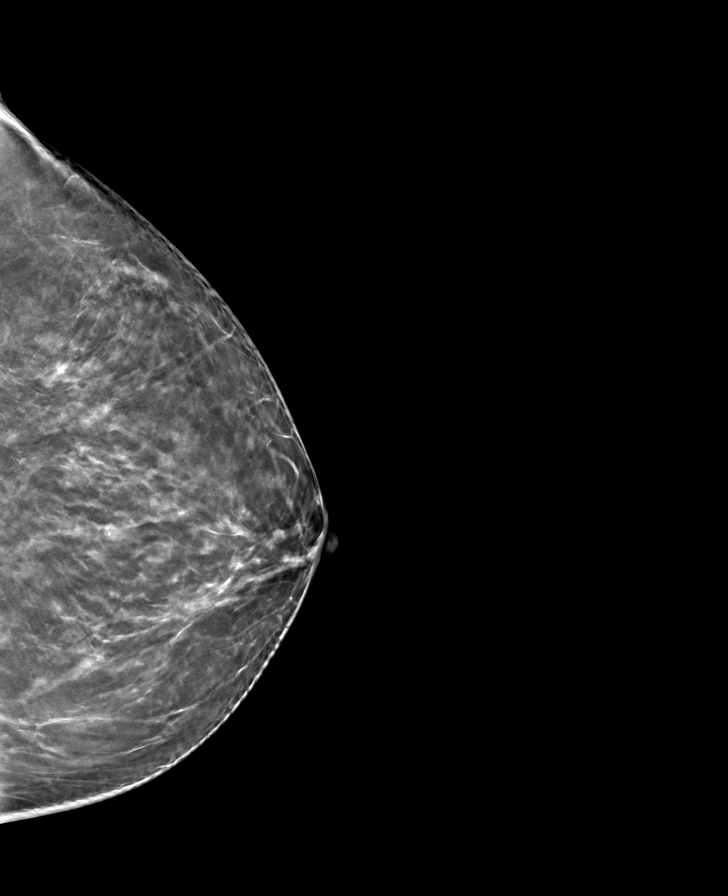

[L MLO tomo · tomo slice 39/76.0]
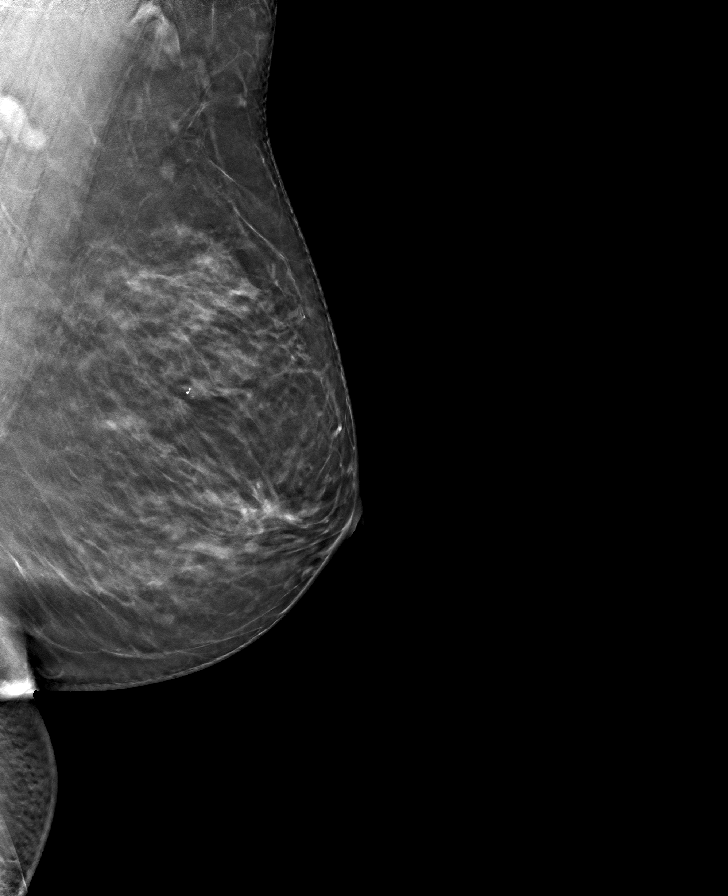

[R CC tomo · tomo slice 39/77.0]
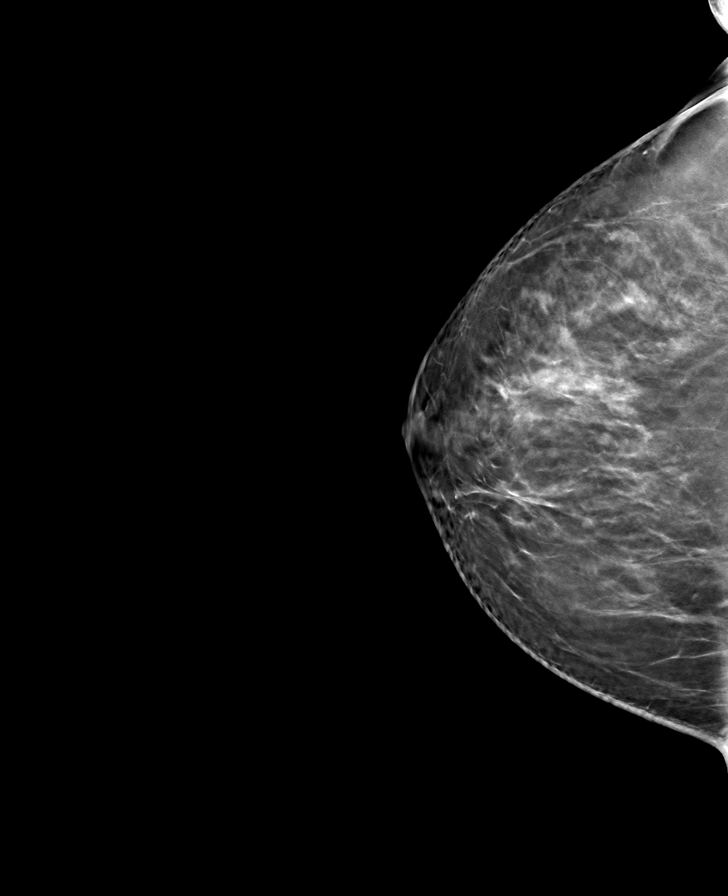

[R MLO tomo · tomo slice 41/81.0]
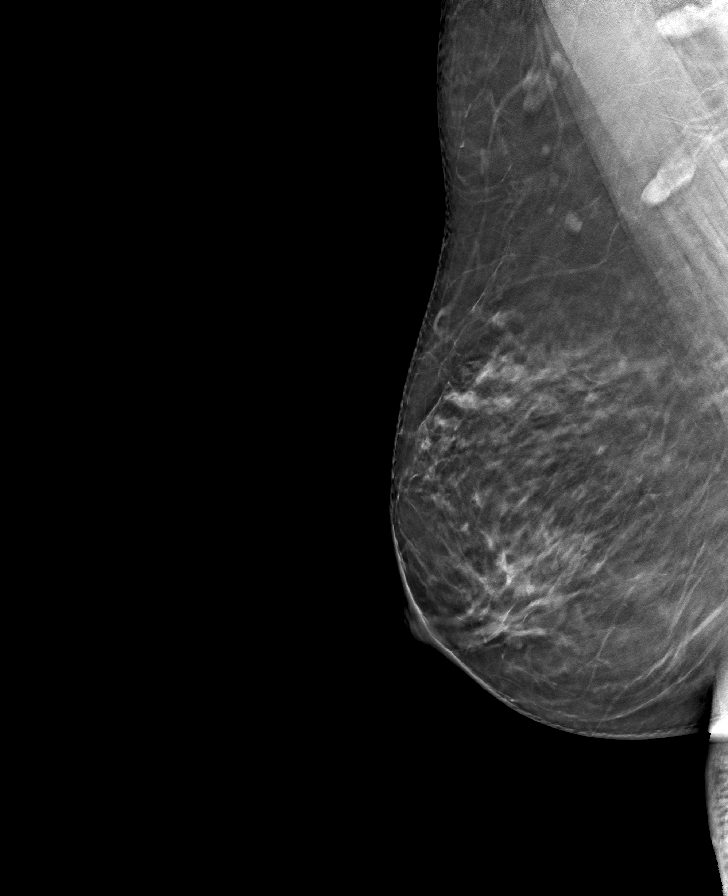

[8 of 24 positions shown; findings below may reference images not displayed]

ACR Breast Density Category b: There are scattered areas of
fibroglandular density.
FINDINGS: There are no findings suspicious for malignancy.
IMPRESSION: No mammographic evidence of malignancy. A result letter of this
screening mammogram will be mailed directly to the patient.

RECOMMENDATION:
Screening mammogram in one year. (Code:51-O-LD2)

BI-RADS CATEGORY  1: Negative.

## 2022-07-02 NOTE — Telephone Encounter (Signed)
Patient has to be seen every 3 months for adhd meds . She was last seen on 03/31/22

## 2022-07-06 NOTE — Telephone Encounter (Signed)
They were working on prior [place] for insurance  last week. Prescription was sent in.

## 2022-07-10 ENCOUNTER — Ambulatory Visit (INDEPENDENT_AMBULATORY_CARE_PROVIDER_SITE_OTHER): Payer: BC Managed Care – PPO

## 2022-07-10 DIAGNOSIS — Z23 Encounter for immunization: Secondary | ICD-10-CM

## 2022-07-21 ENCOUNTER — Ambulatory Visit: Payer: BC Managed Care – PPO | Admitting: Gastroenterology

## 2023-02-11 IMAGING — DX DG CHEST 2V
2 series · 3 of 3 positions shown · non-contrast
Comparison: None Available.

CLINICAL DATA: "Screening". Annual physical exam.

EXAM:
CHEST - 2 VIEW

[Series 2: chest pa · 0.14mm/px · 2 of 2 slices shown]
[im 1/2]
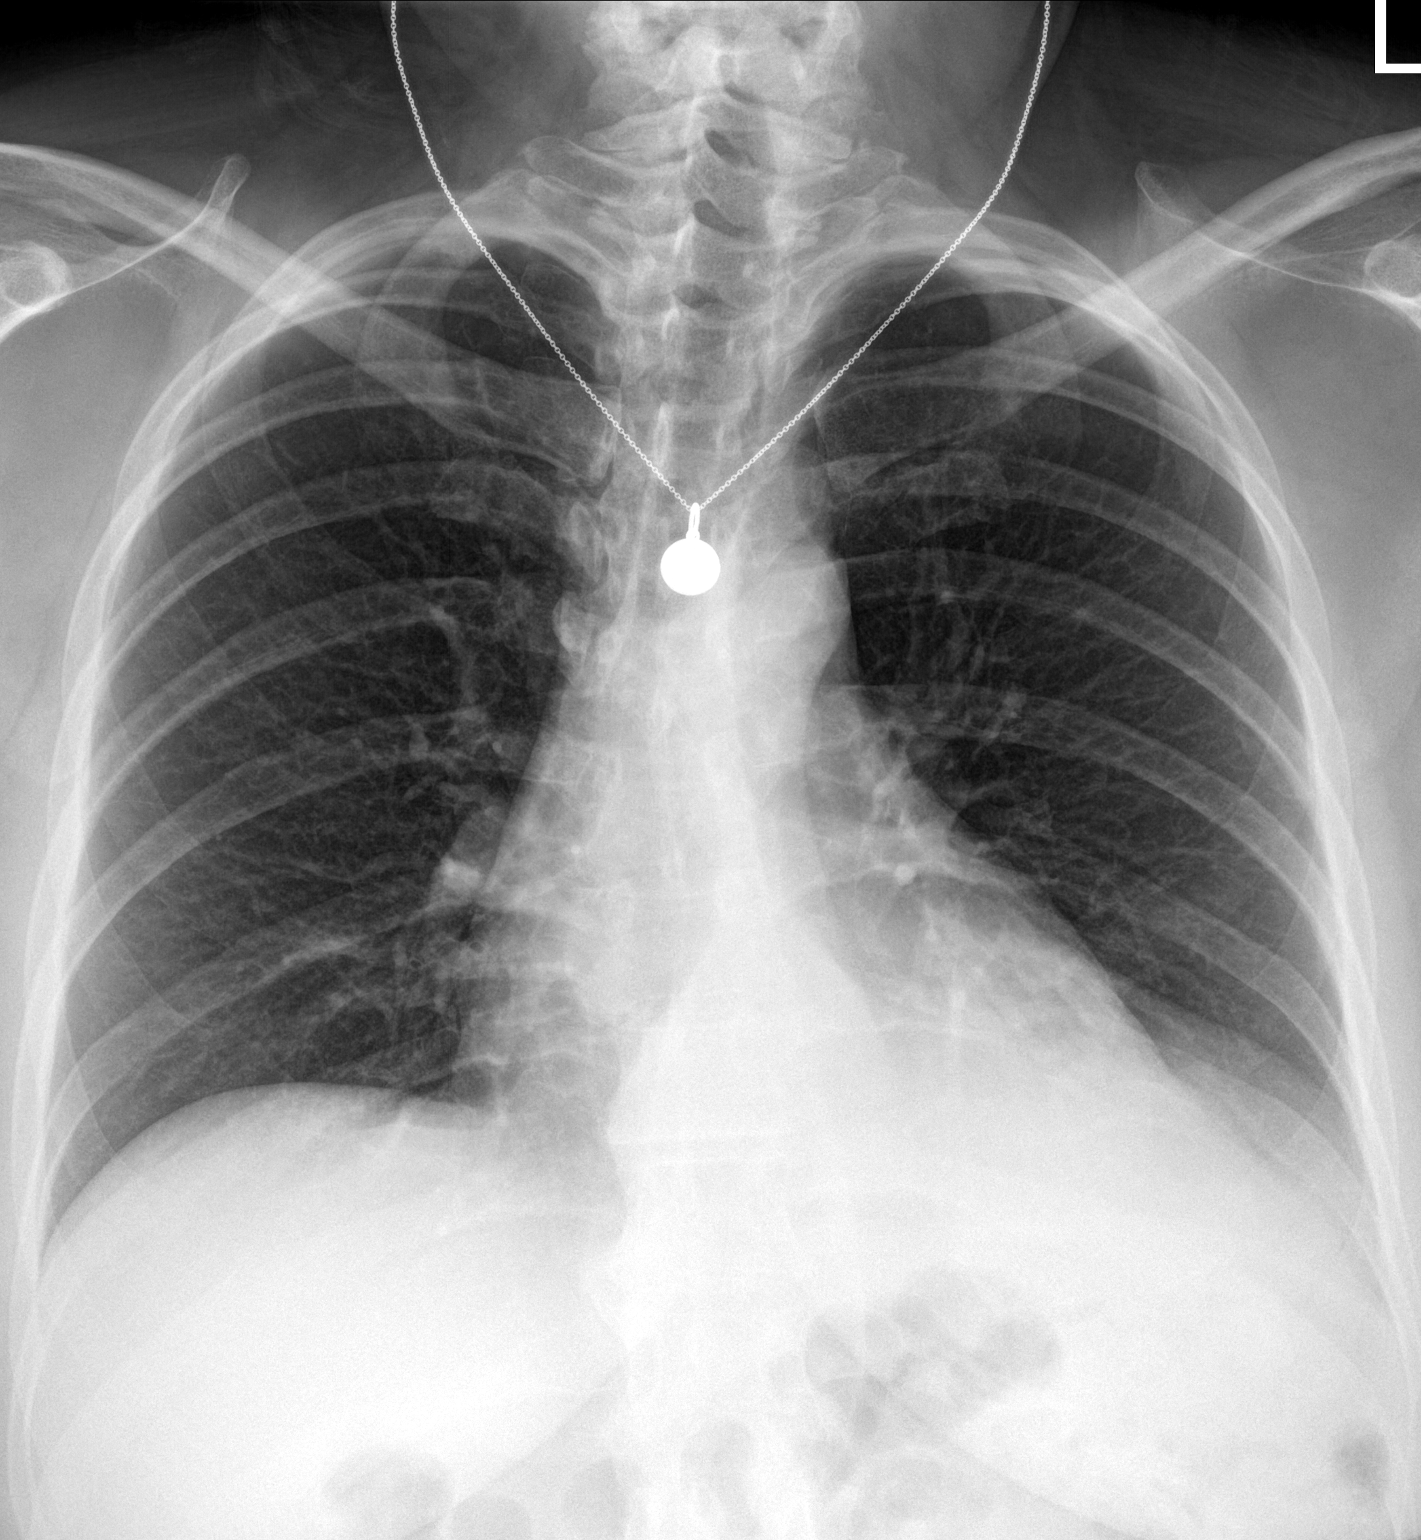
[im 2/2]
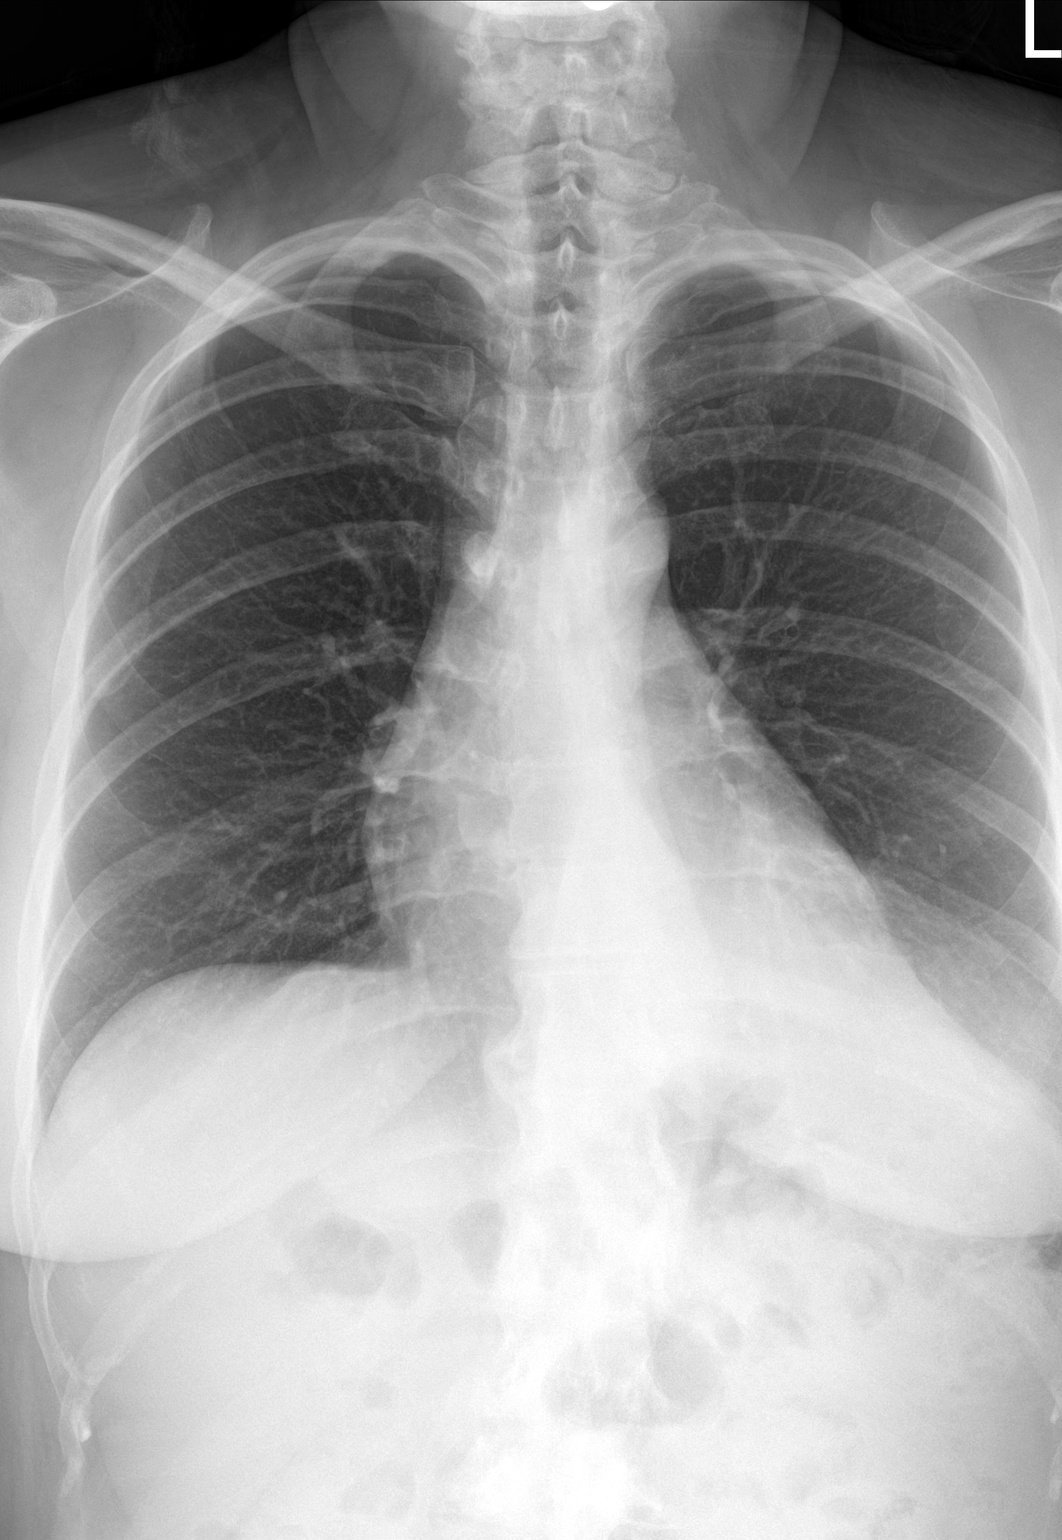

[chest lat]
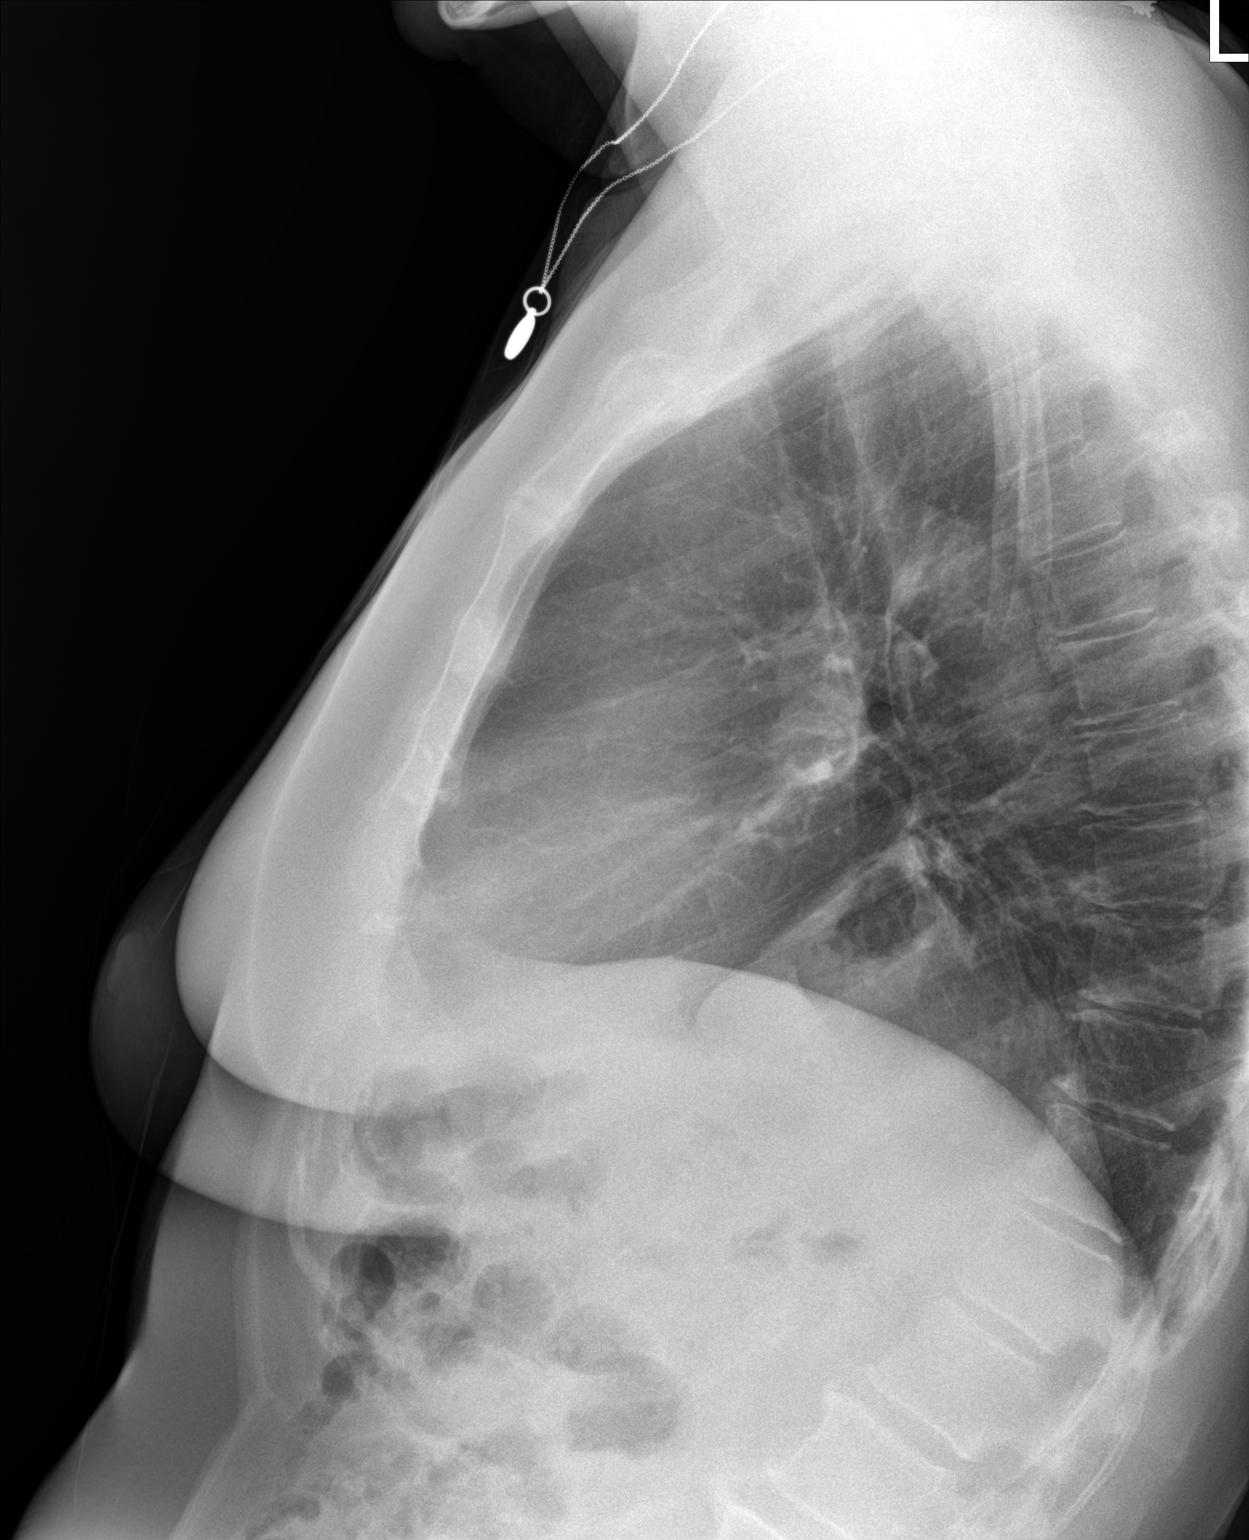

[3 of 3 positions shown; findings below may reference images not displayed]

FINDINGS: Midline trachea. Normal heart size. No pleural effusion or
pneumothorax. Clear right lung. Partial obscuration of the medial
left hemidiaphragm is favored to be related to a moderate hiatal
hernia. There may be adjacent left lower lobe volume loss.
IMPRESSION: Partial obscuration of the medial left hemidiaphragm, favored to
represent a moderate hiatal hernia when correlated with the lateral
view. Possible adjacent medial left lower lobe volume loss. If there
are cardiopulmonary symptoms, consider CT for further evaluation.
Alternatively, if there are remote radiographs for comparison, these
should be reviewed.

These results will be called to the ordering clinician or
representative by the Radiologist Assistant, and communication
documented in the PACS or [REDACTED].

## 2023-03-08 ENCOUNTER — Encounter: Payer: Self-pay | Admitting: Nurse Practitioner

## 2023-03-08 ENCOUNTER — Ambulatory Visit: Payer: BC Managed Care – PPO | Admitting: Nurse Practitioner

## 2023-03-08 VITALS — BP 149/83 | HR 81 | Ht 66.0 in | Wt 204.0 lb

## 2023-03-08 DIAGNOSIS — E559 Vitamin D deficiency, unspecified: Secondary | ICD-10-CM

## 2023-03-08 DIAGNOSIS — Z1211 Encounter for screening for malignant neoplasm of colon: Secondary | ICD-10-CM | POA: Diagnosis not present

## 2023-03-08 DIAGNOSIS — K219 Gastro-esophageal reflux disease without esophagitis: Secondary | ICD-10-CM | POA: Diagnosis not present

## 2023-03-08 MED ORDER — NORGESTIM-ETH ESTRAD TRIPHASIC 0.18/0.215/0.25 MG-35 MCG PO TABS: 1.0000 | ORAL_TABLET | Freq: Every day | ORAL | 11 refills | Status: DC

## 2023-03-08 MED ORDER — ESOMEPRAZOLE MAGNESIUM 20 MG PO CPDR: 20.0000 mg | DELAYED_RELEASE_CAPSULE | Freq: Every day | ORAL | 1 refills | Status: DC

## 2023-03-08 NOTE — Addendum Note (Signed)
Addended by: Bennie Pierini on: 03/08/2023 11:51 AM   Modules accepted: Orders

## 2023-03-08 NOTE — Progress Notes (Addendum)
Subjective:    Patient ID: Whitney Huff, female    DOB: 19-Oct-1971, 52 y.o.   MRN: [id number]   Chief Complaint: medical management of chronic issues     HPI:  Whitney Huff is a 52 y.o. who identifies as a female who was assigned female at birth.   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 Gastroesophageal reflux disease without esophagitis Is on nexium daily which works well for her.  2. Vitamin D deficiency Is on daily vitamin d supplement.       New complaints: None today  No Known Allergies Outpatient Encounter Medications as of 03/08/2023  Medication Sig   cetirizine (ZYRTEC) 10 MG tablet Take 10 mg by mouth daily.   Cholecalciferol (DIALYVITE VITAMIN D 5000) 125 MCG (5000 UT) capsule Take 1 capsule (5,000 Units total) by mouth daily.   esomeprazole (NEXIUM) 20 MG capsule Take 1 capsule (20 mg total) by mouth daily at 12 noon.   Norgestimate-Ethinyl Estradiol Triphasic (TRI-SPRINTEC) 0.18/0.215/0.25 MG-35 MCG tablet Take 1 tablet by mouth daily.   No facility-administered encounter medications on file as of 03/08/2023.    History reviewed. No pertinent surgical history.  History reviewed. No pertinent family history.    Controlled substance contract: n/a     Review of Systems  Constitutional:  Negative for diaphoresis.  Eyes:  Negative for pain.  Respiratory:  Negative for shortness of breath.   Cardiovascular:  Negative for chest pain, palpitations and leg swelling.  Gastrointestinal:  Negative for abdominal pain.  Endocrine: Negative for polydipsia.  Skin:  Negative for rash.  Neurological:  Negative for dizziness, weakness and headaches.  Hematological:  Does not bruise/bleed easily.  All other systems reviewed and are negative.      Objective:   Physical Exam Vitals and nursing  note reviewed.  Constitutional:      General: She is not in acute distress.    Appearance: Normal appearance. She is well-developed.  HENT:     Head: Normocephalic.     Right Ear: Tympanic membrane normal.     Left Ear: Tympanic membrane normal.     Nose: Nose normal.     Mouth/Throat:     Mouth: Mucous membranes are moist.  Eyes:     Pupils: Pupils are equal, round, and reactive to light.  Neck:     Vascular: No carotid bruit or JVD.  Cardiovascular:     Rate and Rhythm: Normal rate and regular rhythm.     Heart sounds: Normal heart sounds.  Pulmonary:     Effort: Pulmonary effort is normal. No respiratory distress.     Breath sounds: Normal breath sounds. No wheezing or rales.  Chest:     Chest wall: No tenderness.  Abdominal:     General: Bowel sounds are normal. There is no distension or abdominal bruit.     Palpations: Abdomen is soft. There is no hepatomegaly, splenomegaly, mass or pulsatile mass.     Tenderness: There is no abdominal tenderness.  Musculoskeletal:        General: Normal range of motion.     Cervical back: Normal range of motion and neck supple.  Lymphadenopathy:     Cervical: No cervical adenopathy.  Skin:    General: Skin is warm and dry.  Neurological:     Mental Status: She is alert and  oriented to person, place, and time.     Deep Tendon Reflexes: Reflexes are normal and symmetric.  Psychiatric:        Behavior: Behavior normal.        Thought Content: Thought content normal.        Judgment: Judgment normal.    BP (!) 149/83   Pulse 81   Ht 5\' 6"  (1.676 m)   Wt 204 lb (92.5 kg)   LMP 02/17/2023   SpO2 98%   BMI 32.93 kg/m         Assessment & Plan:   Whitney Huff in today with chief complaint of Medical Management of Chronic Issues (Refills of OCP)   1. Gastroesophageal reflux disease without esophagitis Avoid spicy foods Do not eat 2 hours prior to bedtime - esomeprazole (NEXIUM) 20 MG capsule; Take 1 capsule (20 mg  total) by mouth daily at 12 noon.  Dispense: 90 capsule; Refill: 1  2. Vitamin D deficiency Continue vitamin d supplement - CBC with Differential/Platelet - CMP14+EGFR - Lipid panel - VITAMIN D 25 Hydroxy (Vit-D Deficiency, Fractures)  Ordered cologuard  The above assessment and management plan was discussed with the patient. The patient verbalized understanding of and has agreed to the management plan. Patient is aware to call the clinic if symptoms persist or worsen. Patient is aware when to return to the clinic for a follow-up visit. Patient educated on when it is appropriate to go to the emergency department.   Whitney Daphine Deutscher, FNP

## 2023-03-08 NOTE — Addendum Note (Signed)
Addended by: Bennie Pierini on: 03/08/2023 11:52 AM   Modules accepted: Orders

## 2023-03-08 NOTE — Patient Instructions (Signed)
Exercising to Stay Healthy To become healthy and stay healthy, it is recommended that you do moderate-intensity and vigorous-intensity exercise. You can tell that you are exercising at a moderate intensity if your heart starts beating faster and you start breathing faster but can still hold a conversation. You can tell that you are exercising at a vigorous intensity if you are breathing much harder and faster and cannot hold a conversation while exercising. How can exercise benefit me? Exercising regularly is important. It has many health benefits, such as: Improving overall fitness, flexibility, and endurance. Increasing bone density. Helping with weight control. Decreasing body fat. Increasing muscle strength and endurance. Reducing stress and tension, anxiety, depression, or anger. Improving overall health. What guidelines should I follow while exercising? Before you start a new exercise program, talk with your health care provider. Do not exercise so much that you hurt yourself, feel dizzy, or get very short of breath. Wear comfortable clothes and wear shoes with good support. Drink plenty of water while you exercise to prevent dehydration or heat stroke. Work out until your breathing and your heartbeat get faster (moderate intensity). How often should I exercise? Choose an activity that you enjoy, and set realistic goals. Your health care provider can help you make an activity plan that is individually designed and works best for you. Exercise regularly as told by your health care provider. This may include: Doing strength training two times a week, such as: Lifting weights. Using resistance bands. Push-ups. Sit-ups. Yoga. Doing a certain intensity of exercise for a given amount of time. Choose from these options: A total of 150 minutes of moderate-intensity exercise every week. A total of 75 minutes of vigorous-intensity exercise every week. A mix of moderate-intensity and  vigorous-intensity exercise every week. Children, pregnant women, people who have not exercised regularly, people who are overweight, and older adults may need to talk with a health care provider about what activities are safe to perform. If you have a medical condition, be sure to talk with your health care provider before you start a new exercise program. What are some exercise ideas? Moderate-intensity exercise ideas include: Walking 1 mile (1.6 km) in about 15 minutes. Biking. Hiking. Golfing. Dancing. Water aerobics. Vigorous-intensity exercise ideas include: Walking 4.5 miles (7.2 km) or more in about 1 hour. Jogging or running 5 miles (8 km) in about 1 hour. Biking 10 miles (16.1 km) or more in about 1 hour. Lap swimming. Roller-skating or in-line skating. Cross-country skiing. Vigorous competitive sports, such as football, basketball, and soccer. Jumping rope. Aerobic dancing. What are some everyday activities that can help me get exercise? Yard work, such as: Pushing a lawn mower. Raking and bagging leaves. Washing your car. Pushing a stroller. Shoveling snow. Gardening. Washing windows or floors. How can I be more active in my day-to-day activities? Use stairs instead of an elevator. Take a walk during your lunch break. If you drive, park your car farther away from your work or school. If you take public transportation, get off one stop early and walk the rest of the way. Stand up or walk around during all of your indoor phone calls. Get up, stretch, and walk around every 30 minutes throughout the day. Enjoy exercise with a friend. Support to continue exercising will help you keep a regular routine of activity. Where to find more information You can find more information about exercising to stay healthy from: U.S. Department of Health and Human Services: www.hhs.gov Centers for Disease Control and Prevention (  CDC): www.cdc.gov Summary Exercising regularly is  important. It will improve your overall fitness, flexibility, and endurance. Regular exercise will also improve your overall health. It can help you control your weight, reduce stress, and improve your bone density. Do not exercise so much that you hurt yourself, feel dizzy, or get very short of breath. Before you start a new exercise program, talk with your health care provider. This information is not intended to replace advice given to you by your health care provider. Make sure you discuss any questions you have with your health care provider. Document Revised: 02/07/2021 Document Reviewed: 02/07/2021 Elsevier Patient Education  2023 Elsevier Inc.  

## 2023-03-25 DIAGNOSIS — Z1211 Encounter for screening for malignant neoplasm of colon: Secondary | ICD-10-CM | POA: Diagnosis not present

## 2023-03-31 LAB — COLOGUARD: COLOGUARD: NEGATIVE

## 2023-04-26 DIAGNOSIS — J329 Chronic sinusitis, unspecified: Secondary | ICD-10-CM | POA: Diagnosis not present

## 2023-06-07 ENCOUNTER — Other Ambulatory Visit: Payer: Self-pay

## 2023-06-07 DIAGNOSIS — Z1231 Encounter for screening mammogram for malignant neoplasm of breast: Secondary | ICD-10-CM

## 2023-08-06 ENCOUNTER — Encounter: Payer: Self-pay | Admitting: Nurse Practitioner

## 2023-08-10 ENCOUNTER — Encounter: Payer: Self-pay | Admitting: Nurse Practitioner

## 2023-11-29 DIAGNOSIS — R070 Pain in throat: Secondary | ICD-10-CM | POA: Diagnosis not present

## 2023-11-29 DIAGNOSIS — Z20822 Contact with and (suspected) exposure to covid-19: Secondary | ICD-10-CM | POA: Diagnosis not present

## 2024-02-12 ENCOUNTER — Other Ambulatory Visit: Payer: Self-pay | Admitting: Nurse Practitioner

## 2024-03-10 ENCOUNTER — Telehealth: Payer: Self-pay

## 2024-03-10 ENCOUNTER — Other Ambulatory Visit: Payer: Self-pay | Admitting: Nurse Practitioner

## 2024-03-10 MED ORDER — NORGESTIM-ETH ESTRAD TRIPHASIC 0.18/0.215/0.25 MG-35 MCG PO TABS: 1.0000 | ORAL_TABLET | Freq: Every day | ORAL | 0 refills | Status: DC

## 2024-03-10 NOTE — Telephone Encounter (Signed)
 Copied from CRM [id number]. Topic: Clinical - Request for Lab/Test Order >> Mar 10, 2024 12:43 PM Danelle Dunning F wrote: Reason for CRM:   Patient is inquiring about when she is in need of TDAP injection. Please call patient to confirm when her next TDAP is required.   Callback Number: [phone]

## 2024-03-10 NOTE — Telephone Encounter (Signed)
 Copied from CRM [id number]. Topic: Clinical - Medication Refill >> Mar 10, 2024 12:36 PM Danelle Dunning F wrote: Patient is stating that she has scheduled a virtual medication review appointment with yher PCP and will definitely come to the visit on May 29th at 9:30 am and has 7 pills left. Due to patient running out of her prescription before the appointment time patient was wondering if the prescriptiuon can be filled. If not, please call patient on her mobile phone number to inform her. [phone]  Medication: Norgestimate-Ethinyl Estradiol Triphasic (TRI-SPRINTEC) 0.18/0.215/0.25 MG-35 MCG tablet  Has the patient contacted their pharmacy? Yes  This is the patient's preferred pharmacy:  [facility] - [place], [place] - [address] [place] [address] Phone: [phone] Fax: [phone]  Is this the correct pharmacy for this prescription? Yes   Has the prescription been filled recently? Yes  Is the patient out of the medication? No but will in 7 days  Has the patient been seen for an appointment in the last year OR does the patient have an upcoming appointment? Yes  Can we respond through MyChart? Yes  Agent: Please be advised that Rx refills may take up to 3 business days. We ask that you follow-up with your pharmacy.

## 2024-03-10 NOTE — Telephone Encounter (Signed)
 Called and advised patient that she is not due for a Tdap until October of this year but we can update it at her next visit. Patient agreed and verbalized understanding

## 2024-03-23 ENCOUNTER — Encounter: Payer: Self-pay | Admitting: Nurse Practitioner

## 2024-03-23 ENCOUNTER — Telehealth: Payer: Self-pay | Admitting: Nurse Practitioner

## 2024-03-23 DIAGNOSIS — K219 Gastro-esophageal reflux disease without esophagitis: Secondary | ICD-10-CM

## 2024-03-23 DIAGNOSIS — E559 Vitamin D deficiency, unspecified: Secondary | ICD-10-CM | POA: Diagnosis not present

## 2024-03-23 MED ORDER — ESOMEPRAZOLE MAGNESIUM 20 MG PO CPDR: 20.0000 mg | DELAYED_RELEASE_CAPSULE | Freq: Every day | ORAL | 1 refills | Status: AC

## 2024-03-23 MED ORDER — NORGESTIM-ETH ESTRAD TRIPHASIC 0.18/0.215/0.25 MG-35 MCG PO TABS: 1.0000 | ORAL_TABLET | Freq: Every day | ORAL | 11 refills | Status: AC

## 2024-03-23 NOTE — Patient Instructions (Signed)
 Exercising to Stay Healthy To become healthy and stay healthy, it is recommended that you do moderate-intensity and vigorous-intensity exercise. You can tell that you are exercising at a moderate intensity if your heart starts beating faster and you start breathing faster but can still hold a conversation. You can tell that you are exercising at a vigorous intensity if you are breathing much harder and faster and cannot hold a conversation while exercising. How can exercise benefit me? Exercising regularly is important. It has many health benefits, such as: Improving overall fitness, flexibility, and endurance. Increasing bone density. Helping with weight control. Decreasing body fat. Increasing muscle strength and endurance. Reducing stress and tension, anxiety, depression, or anger. Improving overall health. What guidelines should I follow while exercising? Before you start a new exercise program, talk with your health care provider. Do not exercise so much that you hurt yourself, feel dizzy, or get very short of breath. Wear comfortable clothes and wear shoes with good support. Drink plenty of water while you exercise to prevent dehydration or heat stroke. Work out until your breathing and your heartbeat get faster (moderate intensity). How often should I exercise? Choose an activity that you enjoy, and set realistic goals. Your health care provider can help you make an activity plan that is individually designed and works best for you. Exercise regularly as told by your health care provider. This may include: Doing strength training two times a week, such as: Lifting weights. Using resistance bands. Push-ups. Sit-ups. Yoga. Doing a certain intensity of exercise for a given amount of time. Choose from these options: A total of 150 minutes of moderate-intensity exercise every week. A total of 75 minutes of vigorous-intensity exercise every week. A mix of moderate-intensity and  vigorous-intensity exercise every week. Children, pregnant women, people who have not exercised regularly, people who are overweight, and older adults may need to talk with a health care provider about what activities are safe to perform. If you have a medical condition, be sure to talk with your health care provider before you start a new exercise program. What are some exercise ideas? Moderate-intensity exercise ideas include: Walking 1 mile (1.6 km) in about 15 minutes. Biking. Hiking. Golfing. Dancing. Water aerobics. Vigorous-intensity exercise ideas include: Walking 4.5 miles (7.2 km) or more in about 1 hour. Jogging or running 5 miles (8 km) in about 1 hour. Biking 10 miles (16.1 km) or more in about 1 hour. Lap swimming. Roller-skating or in-line skating. Cross-country skiing. Vigorous competitive sports, such as football, basketball, and soccer. Jumping rope. Aerobic dancing. What are some everyday activities that can help me get exercise? Yard work, such as: Child psychotherapist. Raking and bagging leaves. Washing your car. Pushing a stroller. Shoveling snow. Gardening. Washing windows or floors. How can I be more active in my day-to-day activities? Use stairs instead of an elevator. Take a walk during your lunch break. If you drive, park your car farther away from your work or school. If you take public transportation, get off one stop early and walk the rest of the way. Stand up or walk around during all of your indoor phone calls. Get up, stretch, and walk around every 30 minutes throughout the day. Enjoy exercise with a friend. Support to continue exercising will help you keep a regular routine of activity. Where to find more information You can find more information about exercising to stay healthy from: [place] Department of Health and Human Services: ThisPath.fi Centers for Disease Control and Prevention (  CDC): [employer].br Summary Exercising regularly is  important. It will improve your overall fitness, flexibility, and endurance. Regular exercise will also improve your overall health. It can help you control your weight, reduce stress, and improve your bone density. Do not exercise so much that you hurt yourself, feel dizzy, or get very short of breath. Before you start a new exercise program, talk with your health care provider. This information is not intended to replace advice given to you by your health care provider. Make sure you discuss any questions you have with your health care provider. Document Revised: 02/07/2021 Document Reviewed: 02/07/2021 Elsevier Patient Education  2024 [employer].

## 2024-03-23 NOTE — Progress Notes (Signed)
 Virtual Visit Consent   Whitney Huff, you are scheduled for a virtual visit with Whitney Gaylyn Keas, FNP, a [facility] provider, today.     Just as with appointments in the office, your consent must be obtained to participate.  Your consent will be active for this visit and any virtual visit you may have with one of our providers in the next 365 days.     If you have a MyChart account, a copy of this consent can be sent to you electronically.  All virtual visits are billed to your insurance company just like a traditional visit in the office.    As this is a virtual visit, video technology does not allow for your provider to perform a traditional examination.  This may limit your provider's ability to fully assess your condition.  If your provider identifies any concerns that need to be evaluated in person or the need to arrange testing (such as labs, EKG, etc.), we will make arrangements to do so.     Although advances in technology are sophisticated, we cannot ensure that it will always work on either your end or our end.  If the connection with a video visit is poor, the visit may have to be switched to a telephone visit.  With either a video or telephone visit, we are not always able to ensure that we have a secure connection.     I need to obtain your verbal consent now.   Are you willing to proceed with your visit today? YES   Whitney Huff has provided verbal consent on 03/23/2024 for a virtual visit (video or telephone).   Whitney Gaylyn Keas, FNP   Date: 03/23/2024 9:38 AM   Virtual Visit via Video Note   I, Whitney Huff, connected with Whitney Huff ([id number], February 09, 1971) on 03/23/24 at  9:30 AM EDT by a video-enabled telemedicine application and verified that I am speaking with the correct person using two identifiers.  Location: Patient: Virtual Visit Location Patient: Home Provider: Virtual Visit Location Provider: Mobile   I discussed the limitations  of evaluation and management by telemedicine and the availability of in person appointments. The patient expressed understanding and agreed to proceed.    History of Present Illness: Whitney Huff is a 53 y.o. who identifies as a female who was assigned female at birth, and is being seen today for chronic follow up.  HPI:   Chief Complaint: No chief complaint on file.    HPI:  Whitney Huff is a 53 y.o. who identifies as a female who was assigned female at birth.   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 Gastroesophageal reflux disease without esophagitis Is on nexium  and is doing well  2. Vitamin D  deficiency Is on daily vitamin d  supplement   New complaints: None today  No Known Allergies Outpatient Encounter Medications as of 03/23/2024  Medication Sig   cetirizine (ZYRTEC) 10 MG tablet Take 10 mg by mouth daily.   Cholecalciferol (DIALYVITE  VITAMIN D  5000) 125 MCG (5000 UT) capsule Take 1 capsule (5,000 Units total) by mouth daily.   esomeprazole  (NEXIUM ) 20 MG capsule Take 1 capsule (20 mg total) by mouth daily at 12 noon.   Norgestimate-Ethinyl Estradiol Triphasic (TRI-SPRINTEC) 0.18/0.215/0.25 MG-35 MCG tablet Take 1 tablet by mouth daily. **NEEDS TO BE SEEN BEFORE NEXT REFILL**   No facility-administered encounter medications on file as of 03/23/2024.  No past surgical history on file.  No family history on file.    Controlled substance contract: n/a  Review of Systems  Constitutional:  Negative for diaphoresis and weight loss.  Eyes:  Negative for blurred vision, double vision and pain.  Respiratory:  Negative for shortness of breath.   Cardiovascular:  Negative for chest pain, palpitations, orthopnea and leg swelling.  Gastrointestinal:  Negative for abdominal pain.  Skin:  Negative for rash.  Neurological:  Negative for dizziness, sensory  change, loss of consciousness, weakness and headaches.  Endo/Heme/Allergies:  Negative for polydipsia. Does not bruise/bleed easily.  Psychiatric/Behavioral:  Negative for memory loss. The patient does not have insomnia.   All other systems reviewed and are negative.   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   Allergic conjunctivitis of both eyes 02/09/2018    Allergies: No Known Allergies Medications:  Current Outpatient Medications:    cetirizine (ZYRTEC) 10 MG tablet, Take 10 mg by mouth daily., Disp: , Rfl:    Cholecalciferol (DIALYVITE  VITAMIN D  5000) 125 MCG (5000 UT) capsule, Take 1 capsule (5,000 Units total) by mouth daily., Disp: 90 capsule, Rfl: 1   esomeprazole  (NEXIUM ) 20 MG capsule, Take 1 capsule (20 mg total) by mouth daily at 12 noon., Disp: 90 capsule, Rfl: 1   Norgestimate-Ethinyl Estradiol Triphasic (TRI-SPRINTEC) 0.18/0.215/0.25 MG-35 MCG tablet, Take 1 tablet by mouth daily. **NEEDS TO BE SEEN BEFORE NEXT REFILL**, Disp: 28 tablet, Rfl: 0  Observations/Objective: Patient is well-developed, well-nourished in no acute distress.  Resting comfortably  at home.  Head is normocephalic, atraumatic.  No labored breathing.  Speech is clear and coherent with logical content.  Patient is alert and oriented at baseline.    Assessment and Plan:   Whitney Huff in today with chief complaint of medical management of chronic issues    1. Gastroesophageal reflux disease without esophagitis (Primary) Avoid spicy foods Do not eat 2 hours prior to bedtime  - esomeprazole  (NEXIUM ) 20 MG capsule; Take 1 capsule (20 mg total) by mouth daily at 12 noon.  Dispense: 90 capsule; Refill: 1  2. Vitamin D  deficiency Continue daily vitamin d  supplement     Follow Up Instructions: I discussed the assessment and treatment  plan with the patient. The patient was provided an opportunity to ask questions and all were answered. The patient agreed with the plan and demonstrated an understanding of the instructions.  A copy of instructions were sent to the patient via MyChart.  The patient was advised to call back or seek an in-person evaluation if the symptoms worsen or if the condition fails to improve as anticipated.  Time:  I spent 7 minutes with the patient via telehealth technology discussing the above problems/concerns.    Whitney Gaylyn Keas, FNP

## 2024-09-19 DIAGNOSIS — D1801 Hemangioma of skin and subcutaneous tissue: Secondary | ICD-10-CM | POA: Diagnosis not present

## 2024-09-19 DIAGNOSIS — L821 Other seborrheic keratosis: Secondary | ICD-10-CM | POA: Diagnosis not present

## 2024-09-19 DIAGNOSIS — L814 Other melanin hyperpigmentation: Secondary | ICD-10-CM | POA: Diagnosis not present

## 2024-09-19 DIAGNOSIS — D1724 Benign lipomatous neoplasm of skin and subcutaneous tissue of left leg: Secondary | ICD-10-CM | POA: Diagnosis not present

## 2024-09-19 DIAGNOSIS — L820 Inflamed seborrheic keratosis: Secondary | ICD-10-CM | POA: Diagnosis not present
